# Patient Record
Sex: Male | Born: 1980 | Race: Black or African American | Hispanic: No | Marital: Single | Smoking: Former smoker
Health system: Southern US, Community
[De-identification: ages and names within clinical notes are randomized; demographics above are authoritative.]

## PROBLEM LIST (undated history)

## (undated) DIAGNOSIS — E669 Obesity, unspecified: Secondary | ICD-10-CM

## (undated) DIAGNOSIS — I10 Essential (primary) hypertension: Secondary | ICD-10-CM

## (undated) DIAGNOSIS — R002 Palpitations: Secondary | ICD-10-CM

## (undated) HISTORY — DX: Palpitations: R00.2

---

## 2001-11-30 ENCOUNTER — Ambulatory Visit (HOSPITAL_COMMUNITY): Admission: RE | Admit: 2001-11-30 | Discharge: 2001-11-30 | Payer: Self-pay | Admitting: Internal Medicine

## 2001-11-30 ENCOUNTER — Encounter: Payer: Self-pay | Admitting: Internal Medicine

## 2004-10-25 ENCOUNTER — Emergency Department (HOSPITAL_COMMUNITY): Admission: EM | Admit: 2004-10-25 | Discharge: 2004-10-25 | Payer: Self-pay | Admitting: Emergency Medicine

## 2011-04-04 ENCOUNTER — Emergency Department (HOSPITAL_COMMUNITY)
Admission: EM | Admit: 2011-04-04 | Discharge: 2011-04-04 | Disposition: A | Discharge status: Home / Self Care | Payer: Self-pay | Attending: Emergency Medicine | Admitting: Emergency Medicine

## 2011-04-04 DIAGNOSIS — R002 Palpitations: Secondary | ICD-10-CM | POA: Insufficient documentation

## 2011-04-05 ENCOUNTER — Emergency Department (HOSPITAL_COMMUNITY): Payer: Self-pay

## 2011-04-05 LAB — DIFFERENTIAL
Basophils Absolute: 0.1 10*3/uL (ref 0.0–0.1)
Basophils Relative: 1 % (ref 0–1)
Eosinophils Absolute: 0.1 10*3/uL (ref 0.0–0.7)
Eosinophils Relative: 1 % (ref 0–5)
Lymphocytes Relative: 36 % (ref 12–46)
Lymphs Abs: 2.7 10*3/uL (ref 0.7–4.0)
Monocytes Absolute: 0.6 10*3/uL (ref 0.1–1.0)
Monocytes Relative: 8 % (ref 3–12)
Neutro Abs: 3.9 10*3/uL (ref 1.7–7.7)
Neutrophils Relative %: 53 % (ref 43–77)

## 2011-04-05 LAB — URINALYSIS, ROUTINE W REFLEX MICROSCOPIC
Bilirubin Urine: NEGATIVE
Glucose, UA: NEGATIVE mg/dL
Hgb urine dipstick: NEGATIVE
Ketones, ur: NEGATIVE mg/dL
Leukocytes, UA: NEGATIVE
Nitrite: NEGATIVE
Protein, ur: NEGATIVE mg/dL
Specific Gravity, Urine: 1.029 (ref 1.005–1.030)
Urobilinogen, UA: 1 mg/dL (ref 0.0–1.0)
pH: 6 (ref 5.0–8.0)

## 2011-04-05 LAB — CBC
HCT: 40.6 % (ref 39.0–52.0)
Hemoglobin: 14.4 g/dL (ref 13.0–17.0)
MCH: 28.9 pg (ref 26.0–34.0)
MCHC: 35.5 g/dL (ref 30.0–36.0)
MCV: 81.4 fL (ref 78.0–100.0)
Platelets: 357 10*3/uL (ref 150–400)
RBC: 4.99 MIL/uL (ref 4.22–5.81)
RDW: 13.4 % (ref 11.5–15.5)
WBC: 7.3 10*3/uL (ref 4.0–10.5)

## 2011-04-05 LAB — URINE CULTURE
Culture  Setup Time: 201207070058
Culture: NO GROWTH

## 2011-04-05 LAB — BASIC METABOLIC PANEL
BUN: 11 mg/dL (ref 6–23)
Chloride: 103 mEq/L (ref 96–112)
GFR calc non Af Amer: >60 mL/min (ref >60)
Glucose, Bld: 111 mg/dL — ABNORMAL HIGH (ref 70–99)
Potassium: 3.5 mEq/L (ref 3.5–5.1)

## 2011-04-05 LAB — CK TOTAL AND CKMB (NOT AT ARMC): CK, MB: 2.8 ng/mL (ref 0.3–4.0)

## 2011-04-07 ENCOUNTER — Encounter: Payer: Self-pay | Admitting: Cardiology

## 2011-04-07 ENCOUNTER — Encounter: Payer: Self-pay | Admitting: *Deleted

## 2011-04-07 ENCOUNTER — Ambulatory Visit (INDEPENDENT_AMBULATORY_CARE_PROVIDER_SITE_OTHER): Payer: Self-pay | Admitting: Cardiology

## 2011-04-07 VITALS — BP 148/94 | HR 104 | Resp 16 | Ht 68.0 in | Wt 371.0 lb

## 2011-04-07 DIAGNOSIS — I10 Essential (primary) hypertension: Secondary | ICD-10-CM

## 2011-04-07 DIAGNOSIS — R002 Palpitations: Secondary | ICD-10-CM

## 2011-04-07 NOTE — Assessment & Plan Note (Signed)
This is secondary to sinus tachycardia. He was using a lot of energy drinks at a time he went to the emergency room. I've advised against these as well as caffeinated beverages in general. This is also related to his deconditioning which should improve with exercise.

## 2011-04-07 NOTE — Patient Instructions (Signed)
Your physician encouraged you to lose weight for better health.  Your physician has requested that you regularly monitor and record your blood pressure readings at home. Please use the same machine at the same time of day to check your readings and record them to bring to your follow-up visit.   Goal blood pressure is 130/80  Follow a very low sodium diet. Please see handout.  Also avoid carbohydrates such as rice, white breads, potatoes.  You may go to the gym with your brother 3-5 hours per week for cardio work out. No heavy lifting.

## 2011-04-07 NOTE — Assessment & Plan Note (Signed)
We had a long talk about this today. Advised at least get below 300 pounds which  would be a success if he could keep it off.

## 2011-04-07 NOTE — Assessment & Plan Note (Signed)
This is a new diagnosis. I suspect this is secondary to his weight, dietary habits, lack of exercise.  I spent 30 minutes talking to him about weight reduction to at least less than 300 pounds over the next year with a diet he can stick with. We've also talked about cardiac activity of at least 3-5 hours per week. We have also talked about eliminating complex carbs, and the calories, and sticking to a low sodium 2 g sodium diet. He will followup with Dr.Abrueve in August. Blood pressure goal was 130/80. He may be able to avoid pharmacological therapy which would be a success.

## 2011-04-07 NOTE — Progress Notes (Signed)
HPI Lance Neal is referred today by Dr. Clarene Duke for the evaluation of palpitations and sinus tachycardia. He also is hypertensive in the office today.  This past Friday, follow work, he developed the feeling that his heart was racing. He went to the emergency room at [facility]. EKG showed sinus tachycardia at a rate of 113 beats per minute, normal otherwise.  His hemoglobin was 14.4, white count 7.3, urinalysis was normal without any sugar, potassium was borderline at 3.5, cardiac marker was negative x1 d-dimer was normal chest x-ray showed no acute cardiopulmonary disease with a normal heart size, and a head CT for her headache was negative.  He is anxious to start exercising with his brother. He is very creative and enjoys singing and rarely does exercise.  He seems very sincere about getting a healthy lifestyle.  He denies orthopnea, PND or edema. His head no chest pain or angina. History reviewed. No pertinent past medical history.  History reviewed. No pertinent past surgical history.  History reviewed. No pertinent family history.  History   Social History  . Marital Status: Single    Spouse Name: N/A    Number of Children: N/A  . Years of Education: N/A   Occupational History  . Not on file.   Social History Main Topics  . Smoking status: Former Smoker -- 1 years    Types: Cigarettes  . Smokeless tobacco: Never Used  . Alcohol Use: No  . Drug Use: No  . Sexually Active:    Other Topics Concern  . Not on file   Social History Narrative  . No narrative on file    Allergies  Allergen Reactions  . No Known Allergies     No current outpatient prescriptions on file.    ROS Negative other than HPI.   PE General Appearance: well developed, well nourished in no acute distress, morbidly obese HEENT: symmetrical face, PERRLA, good dentition  Neck: no JVD, thyromegaly, or adenopathy, trachea midline Chest: symmetric without deformity Cardiac: PMI difficult to  appreciate, RRR, normal S1, S2, no gallop or murmur Lung: clear to ausculation and percussion Vascular: all pulses full without bruits  Abdominal: nondistended, nontender, good bowel sounds, no HSM, no bruits Extremities: no cyanosis, clubbing or edema, no sign of DVT, no varicosities  Skin: normal color, no rashes Neuro: alert and oriented x 3, non-focal Pysch: normal affect Filed Vitals:   04/07/11 1606  BP: 148/94  Pulse: 104  Resp: 16  Height: 5\' 8"  (1.727 m)  Weight: 371 lb (168.284 kg)    EKG  Labs and Studies Reviewed.   Lab Results  Component Value Date   WBC 7.3 04/05/2011   HGB 14.4 04/05/2011   HCT 40.6 04/05/2011   MCV 81.4 04/05/2011   PLT 357 04/05/2011      Chemistry      Component Value Date/Time   NA 140 04/05/2011 0000   K 3.5 04/05/2011 0000   CL 103 04/05/2011 0000   CO2 27 04/05/2011 0000   BUN 11 04/05/2011 0000   CREATININE 0.73 04/05/2011 0000      Component Value Date/Time   CALCIUM 8.8 04/05/2011 0000       No results found for this basename: CHOL   No results found for this basename: HDL   No results found for this basename: LDLCALC   No results found for this basename: TRIG   No results found for this basename: CHOLHDL   No results found for this basename: HGBA1C  No results found for this basename: ALT, AST, GGT, ALKPHOS, BILITOT   No results found for this basename: TSH

## 2012-05-13 ENCOUNTER — Emergency Department (INDEPENDENT_AMBULATORY_CARE_PROVIDER_SITE_OTHER)
Admission: EM | Admit: 2012-05-13 | Discharge: 2012-05-13 | Disposition: A | Discharge status: Home / Self Care | Payer: Self-pay | Source: Home / Self Care | Attending: Emergency Medicine | Admitting: Emergency Medicine

## 2012-05-13 ENCOUNTER — Encounter (HOSPITAL_COMMUNITY): Payer: Self-pay | Admitting: *Deleted

## 2012-05-13 DIAGNOSIS — I10 Essential (primary) hypertension: Secondary | ICD-10-CM

## 2012-05-13 DIAGNOSIS — K029 Dental caries, unspecified: Secondary | ICD-10-CM

## 2012-05-13 DIAGNOSIS — T887XXA Unspecified adverse effect of drug or medicament, initial encounter: Secondary | ICD-10-CM

## 2012-05-13 HISTORY — DX: Obesity, unspecified: E66.9

## 2012-05-13 HISTORY — DX: Essential (primary) hypertension: I10

## 2012-05-13 LAB — POCT I-STAT, CHEM 8
Calcium, Ion: 1.16 mmol/L (ref 1.12–1.23)
HCT: 47 % (ref 39.0–52.0)
Hemoglobin: 16 g/dL (ref 13.0–17.0)
TCO2: 25 mmol/L (ref 0–100)

## 2012-05-13 LAB — POCT URINALYSIS DIP (DEVICE)
Glucose, UA: NEGATIVE mg/dL
Nitrite: NEGATIVE
Urobilinogen, UA: 1 mg/dL (ref 0.0–1.0)

## 2012-05-13 MED ORDER — CHLORHEXIDINE GLUCONATE 0.12 % MT SOLN: OROMUCOSAL | Status: AC

## 2012-05-13 MED ORDER — HYDROCHLOROTHIAZIDE 25 MG PO TABS: 25.0000 mg | ORAL_TABLET | Freq: Every day | ORAL | Status: DC

## 2012-05-13 NOTE — ED Notes (Signed)
Patient stated that he could not void at this time

## 2012-05-13 NOTE — ED Provider Notes (Signed)
History     CSN: [id number]  Arrival date & time 05/13/12  1333   First MD Initiated Contact with Patient 05/13/12 1351      Chief Complaint  Patient presents with  . Hypertension    (Consider location/radiation/quality/duration/timing/severity/associated sxs/prior treatment) HPI Comments: Patient has multiple complaints. First, he complains of posterior upper and lower dental pain where he has had chronic dental decay. No fevers, trismus, purulent drainage from gums. He was evaluated by a dentist to have them removed, but she would not do the procedure until his blood pressure is under control. He has noted that his blood pressures in the 200s/90s to 100s for the past several weeks, so he started himself on a friend's hydralazine. States he is taking somewhere between 25-50 mg a day. He took 75 mg of hydralazine today, reports a headache starting shortly thereafter. He denies any headaches prior to taking the hydralazine. Currently, he denies visual changes, focal paresis, seizures. No chest pain, shortness breath, palpitations, but surely swelling, pain radiating to his back or abdomen. No anuria, hematuria. No illicit drug use, nasal decongestants.  dx'd with HTN last year 03/2011, advised lifestyle changes including weight loss, stress reduction. Put on bystolic which he took for a month and ran out- last time he took this was a year ago. He states this worked very well for his blood pressure.   Patient is a 31 y.o. male presenting with hypertension. The history is provided by the patient. No language interpreter was used.  Hypertension This is a chronic problem. The current episode started more than 1 week ago. The problem occurs constantly. The problem has not changed since onset.Associated symptoms include headaches. Pertinent negatives include no chest pain, no abdominal pain and no shortness of breath. Nothing aggravates the symptoms. Nothing relieves the symptoms.    Past Medical  History  Diagnosis Date  . Hypertension   . Obesity     History reviewed. No pertinent past surgical history.  Family History  Problem Relation Age of Onset  . Cancer Mother   . Diabetes Mother   . Heart failure Mother   . Cancer Father   . Diabetes Father   . Heart failure Father     History  Substance Use Topics  . Smoking status: Former Smoker -- 1 years    Types: Cigarettes  . Smokeless tobacco: Never Used  . Alcohol Use: No      Review of Systems  Respiratory: Negative for shortness of breath.   Cardiovascular: Negative for chest pain.  Gastrointestinal: Negative for abdominal pain.  Neurological: Positive for headaches.    Allergies  No known allergies  Home Medications   Current Outpatient Rx  Name Route Sig Dispense Refill  . ACETAMINOPHEN-CODEINE #3 300-30 MG PO TABS Oral Take 1 tablet by mouth every 4 (four) hours as needed.    . CHLORHEXIDINE GLUCONATE 0.12 % MT SOLN  15 mL swish and spit bid 120 mL 0  . HYDROCHLOROTHIAZIDE 25 MG PO TABS Oral Take 1 tablet (25 mg total) by mouth daily. 30 tablet 0    BP 152/99  Pulse 119  Temp 98.4 F (36.9 C) (Oral)  Resp 18  SpO2 100% BP Readings from Last 3 Encounters:  05/13/12 152/99  04/07/11 148/94     Physical Exam  Vitals reviewed. Constitutional: He appears well-developed and well-nourished. No distress.  HENT:  Head: No trismus in the jaw.  Right Ear: Tympanic membrane normal.  Left Ear: Tympanic membrane normal.  Nose: Nose normal. Right sinus exhibits no maxillary sinus tenderness and no frontal sinus tenderness. Left sinus exhibits no maxillary sinus tenderness and no frontal sinus tenderness.  Mouth/Throat: Uvula is midline, oropharynx is clear and moist and mucous membranes are normal. Abnormal dentition. Dental caries present. No dental abscesses.    Eyes: Conjunctivae and EOM are normal. Pupils are equal, round, and reactive to light.  Fundoscopic exam:      The right eye shows no  hemorrhage and no papilledema.       The left eye shows no hemorrhage and no papilledema.  Neck: Normal range of motion. Neck supple. Normal range of motion present. No Brudzinski's sign and no Kernig's sign noted.  Cardiovascular: Normal rate.   Pulmonary/Chest: Effort normal.  Abdominal: He exhibits no distension.  Musculoskeletal: Normal range of motion.  Lymphadenopathy:    He has no cervical adenopathy.  Neurological: He is alert. He has normal strength and normal reflexes. No cranial nerve deficit or sensory deficit. Coordination and gait normal.  Skin: Skin is warm. No rash noted.  Psychiatric: He has a normal mood and affect. His speech is normal and behavior is normal. Judgment and thought content normal.    ED Course  Procedures (including critical care time)  Labs Reviewed  POCT I-STAT, CHEM 8 - Abnormal; Notable for the following:    Glucose, Bld 106 (*)     All other components within normal limits  POCT URINALYSIS DIP (DEVICE) - Abnormal; Notable for the following:    Ketones, ur 40 (*)     pH 8.5 (*)     All other components within normal limits   No results found. Results for orders placed during the hospital encounter of 05/13/12  POCT I-STAT, CHEM 8      Component Value Range   Sodium 141  135 - 145 mEq/L   Potassium 4.3  3.5 - 5.1 mEq/L   Chloride 104  96 - 112 mEq/L   BUN 6  6 - 23 mg/dL   Creatinine, Ser 1.61  0.50 - 1.35 mg/dL   Glucose, Bld 096 (*) 70 - 99 mg/dL   Calcium, Ion 0.45  4.09 - 1.23 mmol/L   TCO2 25  0 - 100 mmol/L   Hemoglobin 16.0  13.0 - 17.0 g/dL   HCT 81.1  91.4 - 78.2 %  POCT URINALYSIS DIP (DEVICE)      Component Value Range   Glucose, UA NEGATIVE  NEGATIVE mg/dL   Bilirubin Urine NEGATIVE  NEGATIVE   Ketones, ur 40 (*) NEGATIVE mg/dL   Specific Gravity, Urine 1.015  1.005 - 1.030   Hgb urine dipstick NEGATIVE  NEGATIVE   pH 8.5 (*) 5.0 - 8.0   Protein, ur NEGATIVE  NEGATIVE mg/dL   Urobilinogen, UA 1.0  0.0 - 1.0 mg/dL    Nitrite NEGATIVE  NEGATIVE   Leukocytes, UA NEGATIVE  NEGATIVE     1. Hypertension   2. Dental decay   3. Non-dose-related adverse reaction to medication       MDM    Previous chart reviewed. Additional medical history as noted in history of present illness.   Pt hypertensive today. no red flags. Headaches most likely from the large hydralazine dose. He is neurologically intact. No evidence of dental emergency. Discussed importance of lifestyle modifications as important first steps. Will start him on hydrochlorothiazide, have him followup with his primary care physician or return here in one week for repeat blood pressure rechecked. Will start him  on an ACE inhibitor or beta blocker his blood pressure is not improved. Discussed signs and symptoms that should prompt is immediate return to the ER. Patient agrees with plan.     Luiz Blare, MD 05/13/12 1531

## 2012-05-13 NOTE — ED Notes (Signed)
Pt reports htn for the past year that has been untreated (ran out of meds that he was previously taking) - states that he has been taking "family friend's hydralize" for the past 2 weeks.  "i have a headache today and I got a little scared"

## 2012-06-03 IMAGING — CR DG CHEST 2V
2 series · 2 of 2 positions shown · non-contrast
Comparison: None.

CLINICAL DATA: Dizziness and hypertension

CHEST - 2 VIEW

[w chest pa]
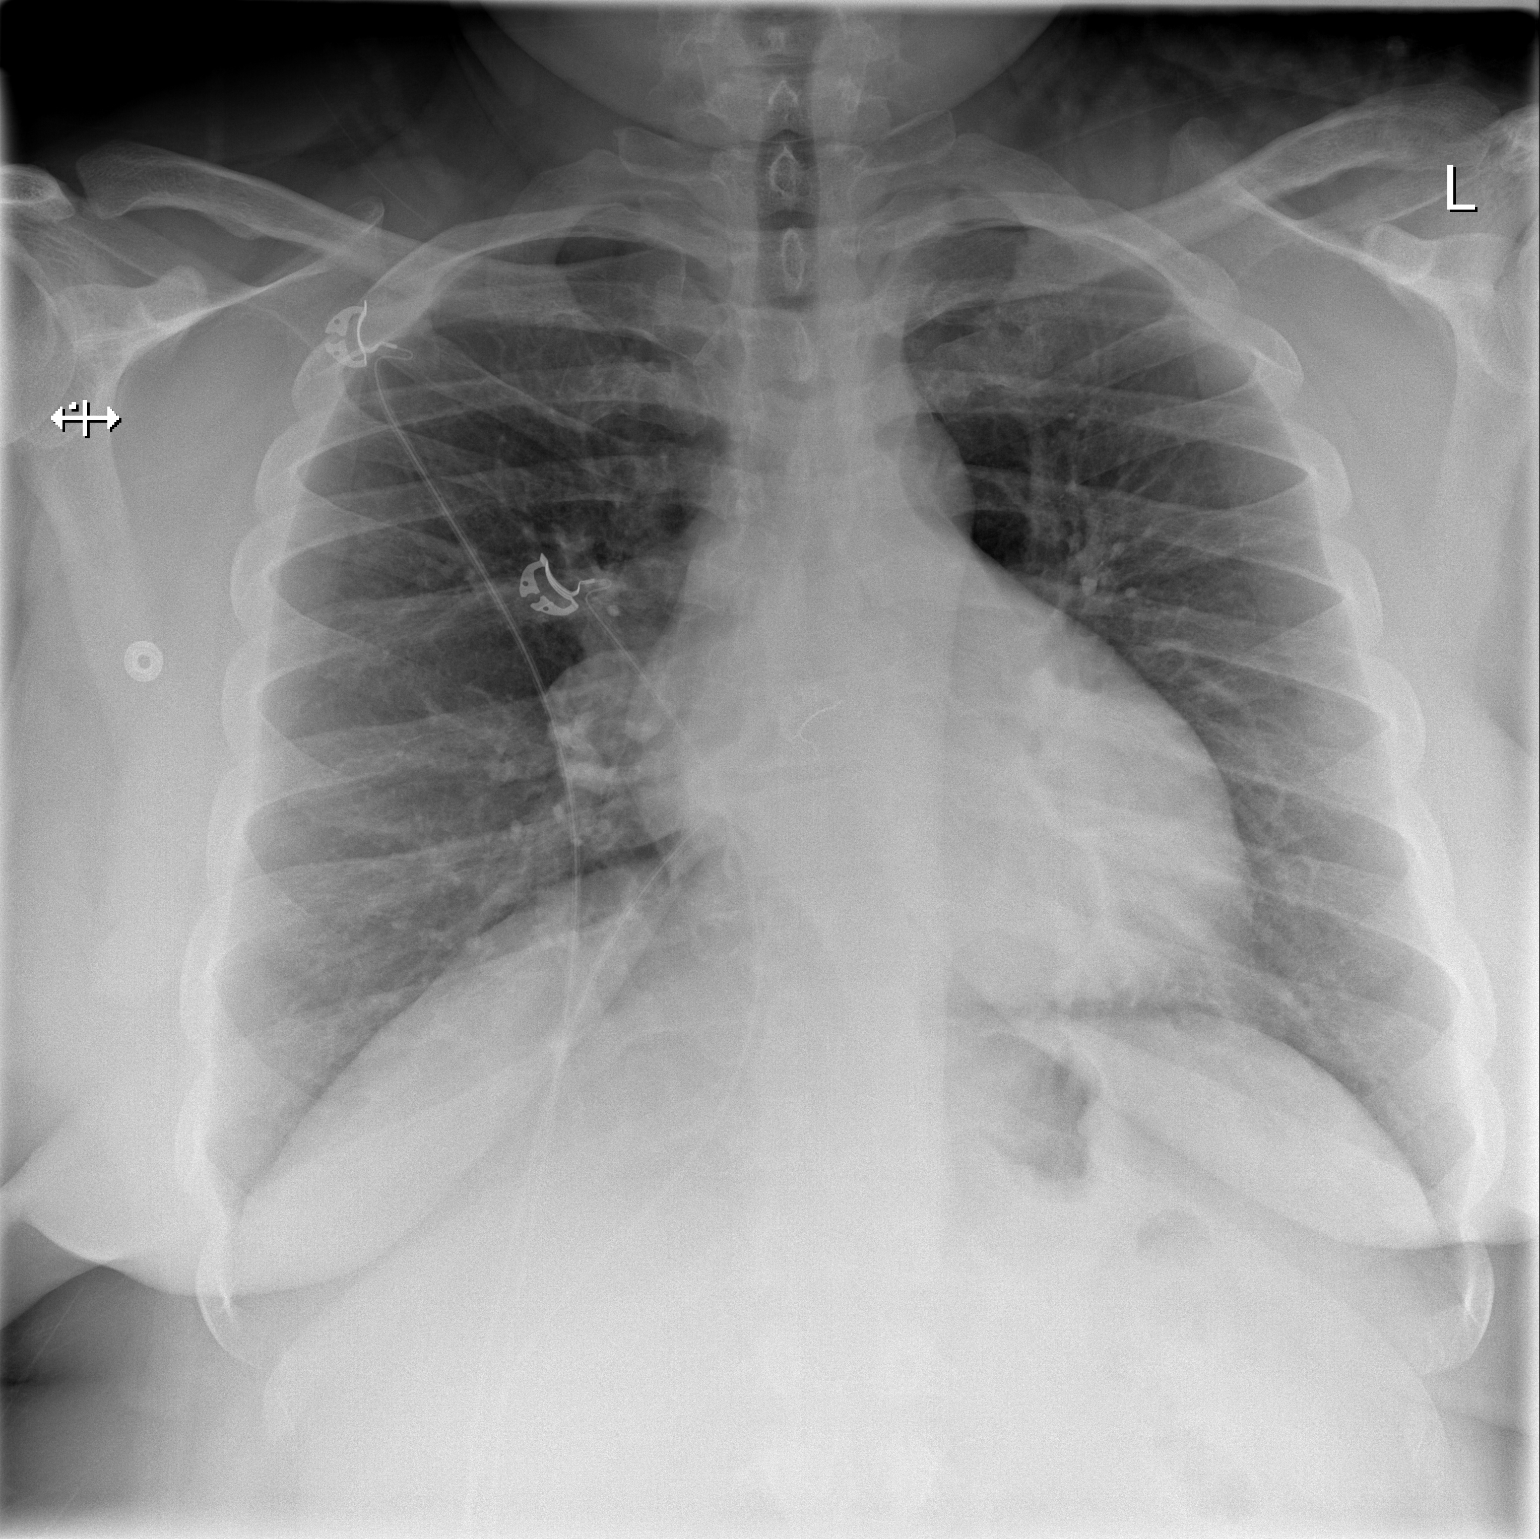

[w chest lat]
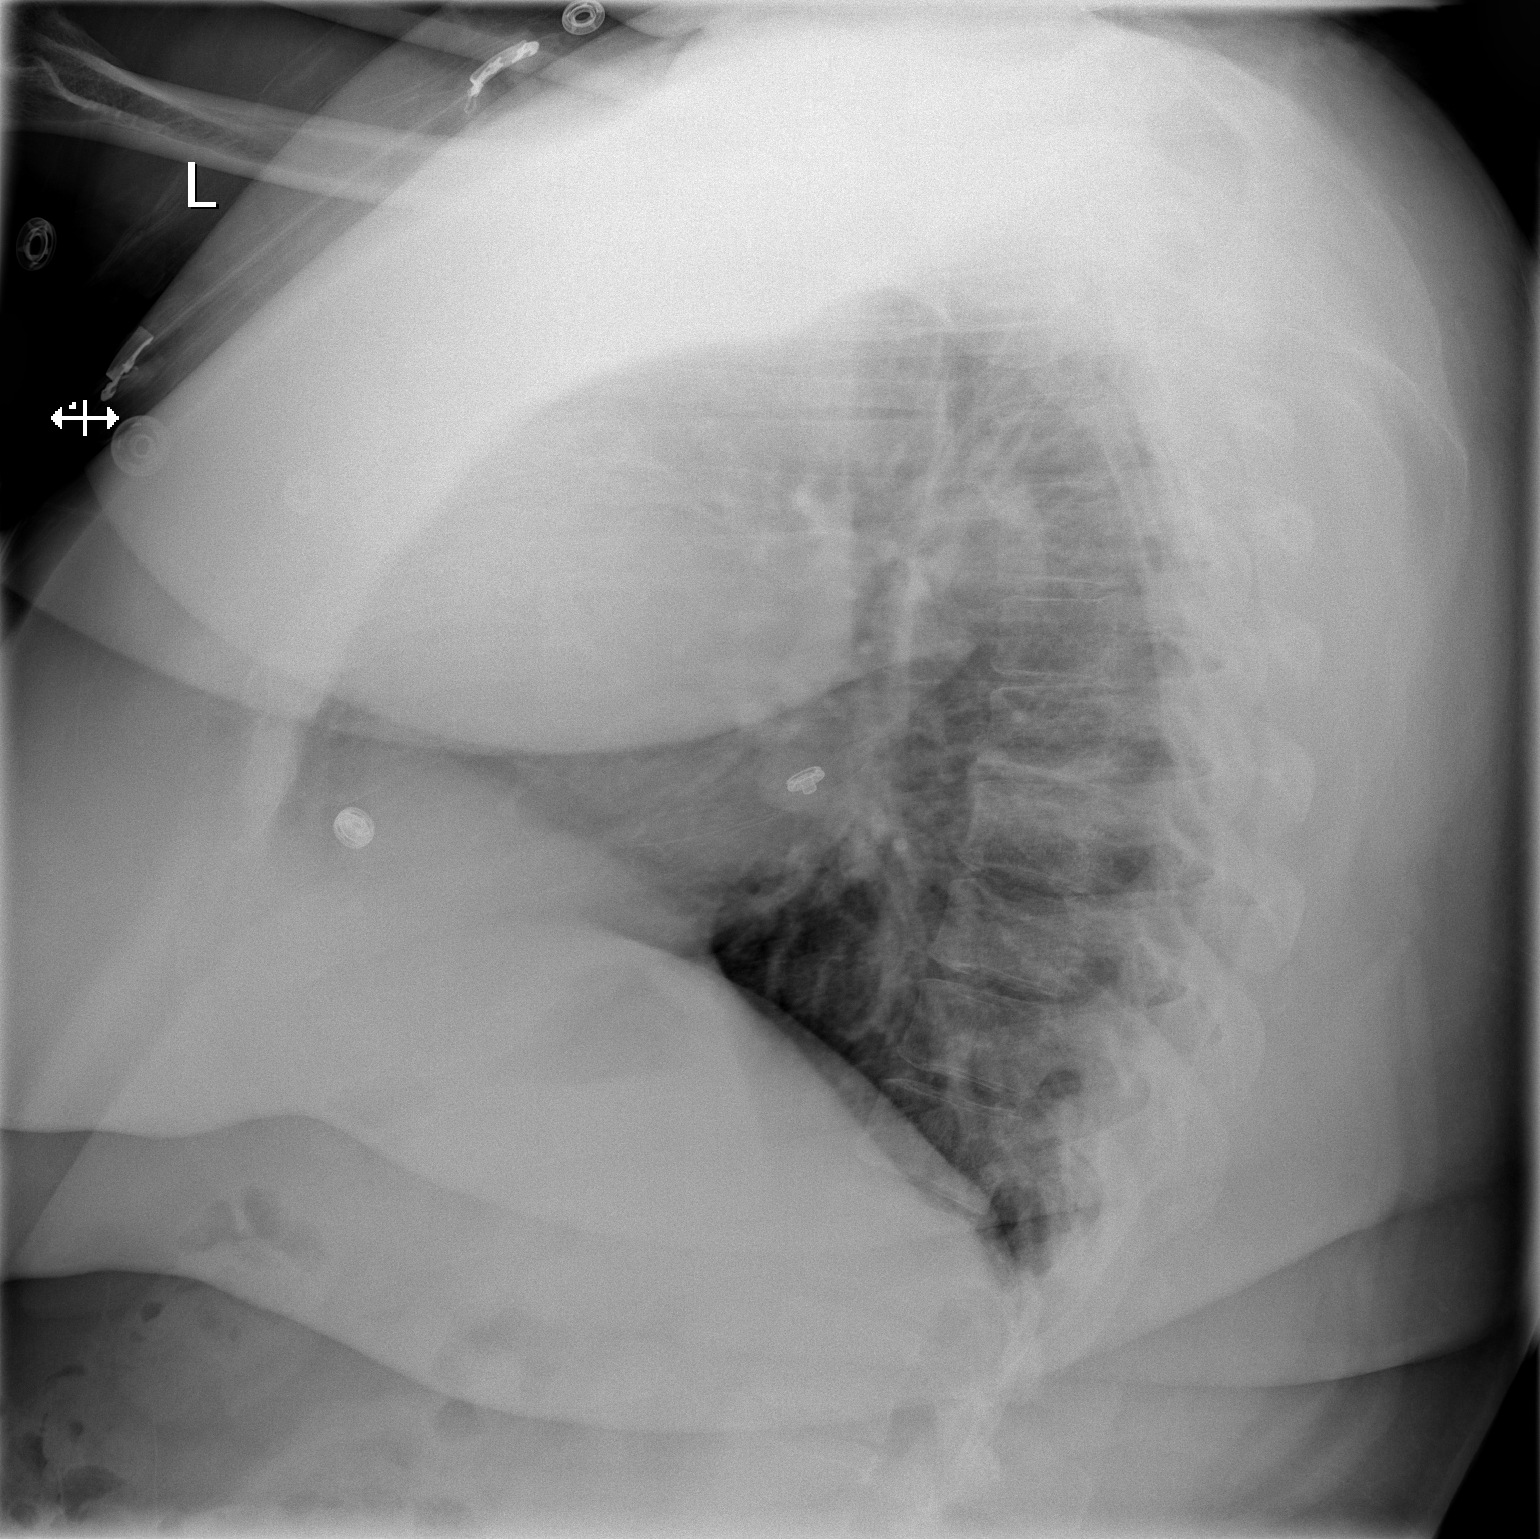

[2 of 2 positions shown; findings below may reference images not displayed]

FINDINGS: Rounded density projected over the right hilum probably
represents a superimposed patch. The heart size and pulmonary
vascularity are normal. The lungs appear clear and expanded without
focal air space disease or consolidation. No blunting of the
costophrenic angles.
IMPRESSION: No evidence of active pulmonary disease.

## 2014-01-12 ENCOUNTER — Emergency Department (INDEPENDENT_AMBULATORY_CARE_PROVIDER_SITE_OTHER)
Admission: EM | Admit: 2014-01-12 | Discharge: 2014-01-12 | Disposition: A | Discharge status: Home / Self Care | Payer: Self-pay | Source: Home / Self Care | Attending: Family Medicine | Admitting: Family Medicine

## 2014-01-12 ENCOUNTER — Encounter (HOSPITAL_COMMUNITY): Payer: Self-pay | Admitting: Emergency Medicine

## 2014-01-12 DIAGNOSIS — I10 Essential (primary) hypertension: Secondary | ICD-10-CM

## 2014-01-12 LAB — POCT I-STAT, CHEM 8
BUN: 15 mg/dL (ref 6–23)
CALCIUM ION: 1.13 mmol/L (ref 1.12–1.23)
CHLORIDE: 99 meq/L (ref 96–112)
CREATININE: 1.1 mg/dL (ref 0.50–1.35)
GLUCOSE: 99 mg/dL (ref 70–99)
HCT: 48 % (ref 39.0–52.0)
Hemoglobin: 16.3 g/dL (ref 13.0–17.0)
Potassium: 3.8 mEq/L (ref 3.7–5.3)
Sodium: 139 mEq/L (ref 137–147)
TCO2: 29 mmol/L (ref 0–100)

## 2014-01-12 MED ORDER — LISINOPRIL 20 MG PO TABS: 20.0000 mg | ORAL_TABLET | Freq: Every day | ORAL | Status: DC

## 2014-01-12 NOTE — ED Notes (Signed)
C/o hypertension States he has been without medication for a year now States he has been trying to lose weight and eat right

## 2014-01-12 NOTE — ED Provider Notes (Signed)
CSN: [id number]     Arrival date & time 01/12/14  1012 History   First MD Initiated Contact with Patient 01/12/14 1116     Chief Complaint  Patient presents with  . Hypertension   (Consider location/radiation/quality/duration/timing/severity/associated sxs/prior Treatment) HPI Comments: Pt has not taken bp meds in a year. Is getting insurance soon, wants to restart meds. Is focusing on diet, weight loss.   Patient is a 33 y.o. male presenting with hypertension. The history is provided by the patient.  Hypertension This is a chronic problem. Episode onset: years ago. The problem occurs constantly. The problem has not changed since onset.Pertinent negatives include no chest pain, no headaches and no shortness of breath. Nothing aggravates the symptoms. Nothing relieves the symptoms. Treatments tried: hctz - pt did not like it. Improvement on treatment: improvement when taking it.    Past Medical History  Diagnosis Date  . Hypertension   . Obesity    History reviewed. No pertinent past surgical history. Family History  Problem Relation Age of Onset  . Cancer Mother   . Diabetes Mother   . Heart failure Mother   . Cancer Father   . Diabetes Father   . Heart failure Father    History  Substance Use Topics  . Smoking status: Former Smoker -- 1 years    Types: Cigarettes  . Smokeless tobacco: Never Used  . Alcohol Use: No    Review of Systems  Constitutional: Negative for unexpected weight change.  Eyes: Negative for visual disturbance.  Respiratory: Negative for shortness of breath.   Cardiovascular: Negative for chest pain, palpitations and leg swelling.  Neurological: Negative for dizziness and headaches.    Allergies  No known allergies  Home Medications   Prior to Admission medications   Medication Sig Start Date End Date Taking? Authorizing Provider  acetaminophen-codeine (TYLENOL #3) 300-30 MG per tablet Take 1 tablet by mouth every 4 (four) hours as needed.     Historical Provider, MD  hydrochlorothiazide (HYDRODIURIL) 25 MG tablet Take 1 tablet (25 mg total) by mouth daily. 05/13/12 05/13/13  Domenick GongAshley Mortenson, MD  lisinopril (PRINIVIL,ZESTRIL) 20 MG tablet Take 1 tablet (20 mg total) by mouth daily. 01/12/14   Cathlyn ParsonsAngela M Drexler Maland, NP   BP 160/83  Pulse 86  Temp(Src) 98.1 F (36.7 C) (Oral)  Resp 18  SpO2 100% Physical Exam  Constitutional: He appears well-developed and well-nourished. No distress.  Morbidly obese  Neck: Carotid bruit is not present.  Cardiovascular: Normal rate and regular rhythm.   No peripheral edema  Pulmonary/Chest: Effort normal and breath sounds normal.    ED Course  Procedures (including critical care time) Labs Review Labs Reviewed  POCT I-STAT, CHEM 8    Results for orders placed during the hospital encounter of 01/12/14  POCT I-STAT, CHEM 8      Result Value Ref Range   Sodium 139  137 - 147 mEq/L   Potassium 3.8  3.7 - 5.3 mEq/L   Chloride 99  96 - 112 mEq/L   BUN 15  6 - 23 mg/dL   Creatinine, Ser 4.091.10  0.50 - 1.35 mg/dL   Glucose, Bld 99  70 - 99 mg/dL   Calcium, Ion 8.111.13  9.141.12 - 1.23 mmol/L   TCO2 29  0 - 100 mmol/L   Hemoglobin 16.3  13.0 - 17.0 g/dL   HCT 78.248.0  95.639.0 - 21.352.0 %   Imaging Review No results found.   MDM   1. Hypertension  Discussed diet and exercise/lifestyle change strategies with patient extensively.  Pt is taking some kind of OTC supplement recommended by Dr. Neil Crouchz on TV. I advised pt I recommended he not use unstudied unregulated supplements. Rx lisinopril 20mg  daily #30, 1 refill. Pt to get pcp.      Cathlyn ParsonsAngela M Yao [place], NP 01/12/14 1407

## 2014-01-12 NOTE — Discharge Instructions (Signed)
Work on the diet changes and increasing exercise as we discussed. Take your medicine every day even if you are feeling well. Try to find a regular doctor to get regular care with your blood pressure and wellness screening/maintenance.    Hypertension As your heart beats, it forces blood through your arteries. This force is your blood pressure. If the pressure is too high, it is called hypertension (HTN) or high blood pressure. HTN is dangerous because you may have it and not know it. High blood pressure may mean that your heart has to work harder to pump blood. Your arteries may be narrow or stiff. The extra work puts you at risk for heart disease, stroke, and other problems.  Blood pressure consists of two numbers, a higher number over a lower, 110/72, for example. It is stated as "110 over 72." The ideal is below 120 for the top number (systolic) and under 80 for the bottom (diastolic). Write down your blood pressure today. You should pay close attention to your blood pressure if you have certain conditions such as:  Heart failure.  Prior heart attack.  Diabetes  Chronic kidney disease.  Prior stroke.  Multiple risk factors for heart disease. To see if you have HTN, your blood pressure should be measured while you are seated with your arm held at the level of the heart. It should be measured at least twice. A one-time elevated blood pressure reading (especially in the Emergency Department) does not mean that you need treatment. There may be conditions in which the blood pressure is different between your right and left arms. It is important to see your caregiver soon for a recheck. Most people have essential hypertension which means that there is not a specific cause. This type of high blood pressure may be lowered by changing lifestyle factors such as:  Stress.  Smoking.  Lack of exercise.  Excessive weight.  Drug/tobacco/alcohol use.  Eating less salt. Most people do not have  symptoms from high blood pressure until it has caused damage to the body. Effective treatment can often prevent, delay or reduce that damage. TREATMENT  When a cause has been identified, treatment for high blood pressure is directed at the cause. There are a large number of medications to treat HTN. These fall into several categories, and your caregiver will help you select the medicines that are best for you. Medications may have side effects. You should review side effects with your caregiver. If your blood pressure stays high after you have made lifestyle changes or started on medicines,   Your medication(s) may need to be changed.  Other problems may need to be addressed.  Be certain you understand your prescriptions, and know how and when to take your medicine.  Be sure to follow up with your caregiver within the time frame advised (usually within two weeks) to have your blood pressure rechecked and to review your medications.  If you are taking more than one medicine to lower your blood pressure, make sure you know how and at what times they should be taken. Taking two medicines at the same time can result in blood pressure that is too low. SEEK IMMEDIATE MEDICAL CARE IF:  You develop a severe headache, blurred or changing vision, or confusion.  You have unusual weakness or numbness, or a faint feeling.  You have severe chest or abdominal pain, vomiting, or breathing problems. MAKE SURE YOU:   Understand these instructions.  Will watch your condition.  Will get help  right away if you are not doing well or get worse. Document Released: 09/15/2005 Document Revised: 12/08/2011 Document Reviewed: 05/05/2008 [facility] Patient Information 2014 [employer], [place].

## 2014-01-13 NOTE — ED Provider Notes (Signed)
Medical screening examination/treatment/procedure(s) were performed by a resident physician or non-physician practitioner and as the supervising physician I was immediately available for consultation/collaboration.  Chavie Kolinski, MD    Jeffie Spivack S Suzie Vandam, MD 01/13/14 0806 

## 2014-03-31 ENCOUNTER — Ambulatory Visit: Payer: Self-pay | Admitting: Family Medicine

## 2016-06-20 ENCOUNTER — Ambulatory Visit (INDEPENDENT_AMBULATORY_CARE_PROVIDER_SITE_OTHER): Payer: PRIVATE HEALTH INSURANCE | Admitting: Physician Assistant

## 2016-06-20 ENCOUNTER — Encounter: Payer: Self-pay | Admitting: Physician Assistant

## 2016-06-20 VITALS — BP 162/104 | HR 87 | Temp 98.2°F | Resp 17 | Ht 69.0 in | Wt 357.0 lb

## 2016-06-20 DIAGNOSIS — I10 Essential (primary) hypertension: Secondary | ICD-10-CM | POA: Diagnosis not present

## 2016-06-20 DIAGNOSIS — E669 Obesity, unspecified: Secondary | ICD-10-CM

## 2016-06-20 LAB — COMPLETE METABOLIC PANEL WITH GFR
ALBUMIN: 3.8 g/dL (ref 3.6–5.1)
ALK PHOS: 63 U/L (ref 40–115)
ALT: 17 U/L (ref 9–46)
AST: 20 U/L (ref 10–40)
BUN: 8 mg/dL (ref 7–25)
CO2: 28 mmol/L (ref 20–31)
Calcium: 8.8 mg/dL (ref 8.6–10.3)
Chloride: 103 mmol/L (ref 98–110)
Creat: 0.87 mg/dL (ref 0.60–1.35)
GFR, Est African American: >89 mL/min (ref >=60)
GLUCOSE: 111 mg/dL — AB (ref 65–99)
POTASSIUM: 4.4 mmol/L (ref 3.5–5.3)
SODIUM: 138 mmol/L (ref 135–146)
Total Bilirubin: 0.5 mg/dL (ref 0.2–1.2)
Total Protein: 7.7 g/dL (ref 6.1–8.1)

## 2016-06-20 LAB — LIPID PANEL
CHOL/HDL RATIO: 4.1 ratio (ref <=5.0)
Cholesterol: 181 mg/dL (ref 125–200)
HDL: 44 mg/dL (ref >=40)
LDL Cholesterol: 122 mg/dL (ref <130)
TRIGLYCERIDES: 77 mg/dL (ref <150)
VLDL: 15 mg/dL (ref <30)

## 2016-06-20 MED ORDER — LISINOPRIL-HYDROCHLOROTHIAZIDE 20-12.5 MG PO TABS: 1.0000 | ORAL_TABLET | Freq: Every day | ORAL | 1 refills | Status: DC

## 2016-06-20 NOTE — Patient Instructions (Addendum)
Please take your blood pressure twice per week.  I would like you to follow up with me in 2 weeks.  Please read the diet plan below for increased guidance of what to eat.  DASH Eating Plan DASH stands for "Dietary Approaches to Stop Hypertension." The DASH eating plan is a healthy eating plan that has been shown to reduce high blood pressure (hypertension). Additional health benefits may include reducing the risk of type 2 diabetes mellitus, heart disease, and stroke. The DASH eating plan may also help with weight loss. WHAT DO I NEED TO KNOW ABOUT THE DASH EATING PLAN? For the DASH eating plan, you will follow these general guidelines:  Choose foods with a percent daily value for sodium of less than 5% (as listed on the food label).  Use salt-free seasonings or herbs instead of table salt or sea salt.  Check with your health care provider or pharmacist before using salt substitutes.  Eat lower-sodium products, often labeled as "lower sodium" or "no salt added."  Eat fresh foods.  Eat more vegetables, fruits, and low-fat dairy products.  Choose whole grains. Look for the word "whole" as the first word in the ingredient list.  Choose fish and skinless chicken or [place] more often than red meat. Limit fish, poultry, and meat to 6 oz (170 g) each day.  Limit sweets, desserts, sugars, and sugary drinks.  Choose heart-healthy fats.  Limit cheese to 1 oz (28 g) per day.  Eat more home-cooked food and less restaurant, buffet, and fast food.  Limit fried foods.  Cook foods using methods other than frying.  Limit canned vegetables. If you do use them, rinse them well to decrease the sodium.  When eating at a restaurant, ask that your food be prepared with less salt, or no salt if possible. WHAT FOODS CAN I EAT? Seek help from a dietitian for individual calorie needs. Grains Whole grain or whole wheat bread. Brown rice. Whole grain or whole wheat pasta. Quinoa, bulgur, and whole grain  cereals. Low-sodium cereals. Corn or whole wheat flour tortillas. Whole grain cornbread. Whole grain crackers. Low-sodium crackers. Vegetables Fresh or frozen vegetables (raw, steamed, roasted, or grilled). Low-sodium or reduced-sodium tomato and vegetable juices. Low-sodium or reduced-sodium tomato sauce and paste. Low-sodium or reduced-sodium canned vegetables.  Fruits All fresh, canned (in natural juice), or frozen fruits. Meat and Other Protein Products Ground beef (85% or leaner), grass-fed beef, or beef trimmed of fat. Skinless chicken or [place]. Ground chicken or [place]. Pork trimmed of fat. All fish and seafood. Eggs. Dried beans, peas, or lentils. Unsalted nuts and seeds. Unsalted canned beans. Dairy Low-fat dairy products, such as skim or 1% milk, 2% or reduced-fat cheeses, low-fat ricotta or cottage cheese, or plain low-fat yogurt. Low-sodium or reduced-sodium cheeses. Fats and Oils Tub margarines without trans fats. Light or reduced-fat mayonnaise and salad dressings (reduced sodium). Avocado. Safflower, olive, or canola oils. Natural peanut or almond butter. Other Unsalted popcorn and pretzels. The items listed above may not be a complete list of recommended foods or beverages. Contact your dietitian for more options. WHAT FOODS ARE NOT RECOMMENDED? Grains White bread. White pasta. White rice. Refined cornbread. Bagels and croissants. Crackers that contain trans fat. Vegetables Creamed or fried vegetables. Vegetables in a cheese sauce. Regular canned vegetables. Regular canned tomato sauce and paste. Regular tomato and vegetable juices. Fruits Dried fruits. Canned fruit in light or heavy syrup. Fruit juice. Meat and Other Protein Products Fatty cuts of meat. Ribs, chicken wings,  bacon, sausage, bologna, salami, chitterlings, fatback, hot dogs, bratwurst, and packaged luncheon meats. Salted nuts and seeds. Canned beans with salt. Dairy Whole or 2% milk, cream, half-and-half, and  cream cheese. Whole-fat or sweetened yogurt. Full-fat cheeses or blue cheese. Nondairy creamers and whipped toppings. Processed cheese, cheese spreads, or cheese curds. Condiments Onion and garlic salt, seasoned salt, table salt, and sea salt. Canned and packaged gravies. Worcestershire sauce. Tartar sauce. Barbecue sauce. Teriyaki sauce. Soy sauce, including reduced sodium. Steak sauce. Fish sauce. Oyster sauce. Cocktail sauce. Horseradish. Ketchup and mustard. Meat flavorings and tenderizers. Bouillon cubes. Hot sauce. Tabasco sauce. Marinades. Taco seasonings. Relishes. Fats and Oils Butter, stick margarine, lard, shortening, ghee, and bacon fat. Coconut, palm kernel, or palm oils. Regular salad dressings. Other Pickles and olives. Salted popcorn and pretzels. The items listed above may not be a complete list of foods and beverages to avoid. Contact your dietitian for more information. WHERE CAN I FIND MORE INFORMATION? National Heart, Lung, and Blood Institute: CablePromo.itwww.nhlbi.nih.gov/health/health-topics/topics/dash/   This information is not intended to replace advice given to you by your health care provider. Make sure you discuss any questions you have with your health care provider.   Document Released: 09/04/2011 Document Revised: 10/06/2014 Document Reviewed: 07/20/2013 Elsevier Interactive Patient Education 2016 [employer]     IF you received an x-ray today, you will receive an invoice from [facility]. Please contact [facility] at [phone] with questions or concerns regarding your invoice.   IF you received labwork today, you will receive an invoice from [facility]. Please contact [facility] at [phone] with questions or concerns regarding your invoice.   Our billing staff will not be able to assist you with questions regarding bills from these companies.  You will be contacted with the lab results as soon as they are available.  The fastest way to get your results is to activate your My Chart account. Instructions are located on the last page of this paperwork. If you have not heard from us regarding the results in 2 weeks, please contact this office.

## 2016-06-20 NOTE — Progress Notes (Signed)
Patient ID: Lance Neal, male   DOB: 02/07/1981, 35 y.o.   MRN: [id number] [facility] [address], [place] [address] [phone]  By signing my name below, I, Lance Neal, attest that this documentation has been prepared under the direction and in the presence of Trena PlattStephanie English, PA-C Electronically Signed: Charline BillsEssence Neal, ED Scribe 06/20/2016 at 8:43 AM.  Date:  06/20/2016   Name:  Lance ConstableLarry D Tanksley   DOB:  08/03/1981   MRN:  [id number]  PCP:  No PCP Per Patient   History of Present Illness:  Lance Neal is a 35 y.o. male patient who [facility]. Pt's triage BP: 162/104. Pt states that he was scheduled to have a root canal but was unable to have the procedure due to elevated BP. He has been taking a baby Aspirin daily. Pt denies chest pain, new palpitations, leg swelling, sob, new HAs. He does report a h/o chest palpitations. He states that he has not been exercising and has been eating a lot of candy bars, fast foods, soda and sweet tea. He states that he has stopped consuming pork chops. Pt states that he drinks ~ 0.5 gallon of water/day. He reports a familial h/o DM on both his paternal and maternal side. Pt states that he has tried garcinia cambogia and coffee cleanser a few years ago and lost 30 lbs in 1 month.   Patient Active Problem List   Diagnosis Date Noted   Hypertension 04/07/2011   Palpitations 04/07/2011   Obesity, morbid (more than 100 lbs over ideal weight or BMI > 40) (HCC) 04/07/2011    Past Medical History:  Diagnosis Date   Hypertension    Obesity     No past surgical history on file.  Social History  Substance Use Topics   Smoking status: Current Some Day Smoker    Years: 1.00    Types: Cigarettes   Smokeless tobacco: Never Used   Alcohol use No    Family History  Problem Relation Age of Onset   Cancer Mother    Diabetes Mother    Heart failure Mother    Cancer Father     Diabetes Father    Heart failure Father     Allergies  Allergen Reactions   No Known Allergies     Medication list has been reviewed and updated.  Current Outpatient Prescriptions on File Prior to Visit  Medication Sig Dispense Refill   hydrochlorothiazide (HYDRODIURIL) 25 MG tablet Take 1 tablet (25 mg total) by mouth daily. 30 tablet 0   lisinopril (PRINIVIL,ZESTRIL) 20 MG tablet Take 1 tablet (20 mg total) by mouth daily. (Patient not taking: Reported on 06/20/2016) 30 tablet 1   [DISCONTINUED] hydrALAZINE (APRESOLINE) 50 MG tablet Take 50 mg by mouth 3 (three) times daily.     No current facility-administered medications on file prior to visit.     Review of Systems  Respiratory: Negative for shortness of breath.   Cardiovascular: Negative for chest pain, palpitations and leg swelling.  Neurological: Negative for headaches.    Physical Examination: BP (!) 162/104 (BP Location: Right Arm, Patient Position: Sitting, Cuff Size: Large)    Pulse 87    Temp 98.2 F (36.8 C) (Oral)    Resp 17    Ht 5\' 9"  (1.753 m)    Wt (!) 357 lb (161.9 kg)    SpO2 96%    BMI 52.72 kg/m  Ideal Body  Weight: @FLOWAMB ([id number])@  Physical Exam  Constitutional: He is oriented to person, place, and time. He appears well-developed and well-nourished. No distress.  HENT:  Head: Normocephalic and atraumatic.  Eyes: Conjunctivae and EOM are normal. Pupils are equal, round, and reactive to light.  Cardiovascular: Normal rate, regular rhythm, normal heart sounds and normal pulses.  Exam reveals no gallop and no friction rub.   No murmur heard. Pulmonary/Chest: Effort normal and breath sounds normal. No respiratory distress.  Neurological: He is alert and oriented to person, place, and time.  Skin: Skin is warm and dry. He is not diaphoretic.  Psychiatric: He has a normal mood and affect. His behavior is normal.    Assessment and Plan: Lance Neal is a 35 y.o. male who is here today for  blood pressure recheckAnd restart of meds. Warned of possible complications if patient does not keep his blood pressure under control and limits his food intake. Given the DASH diet to start. Advise for him to return in 2 weeks for follow-up of blood pressure.  Essential hypertension - Plan: lisinopril-hydrochlorothiazide (PRINZIDE,ZESTORETIC) 20-12.5 MG tablet, COMPLETE METABOLIC PANEL WITH GFR, Lipid panel  Obesity - Plan: lisinopril-hydrochlorothiazide (PRINZIDE,ZESTORETIC) 20-12.5 MG tablet   Trena Platt, PA-C [facility] 06/20/2016 8:30 AM

## 2016-08-05 ENCOUNTER — Other Ambulatory Visit: Payer: Self-pay

## 2016-08-05 DIAGNOSIS — I10 Essential (primary) hypertension: Secondary | ICD-10-CM

## 2016-08-05 NOTE — Telephone Encounter (Signed)
06/20/16 last ov and refill Pharmacy requests 90 day

## 2016-08-06 NOTE — Telephone Encounter (Signed)
L/m with response and need for recheck of b/p

## 2016-08-06 NOTE — Telephone Encounter (Signed)
On 9/22, Lance Neal provided a 30-day supply with a refill, and asked the patient to RTC in 2 weeks to recheck the BP. 1. He shouldn't be out 2. He needs re-evaluation

## 2016-08-14 ENCOUNTER — Encounter: Payer: Self-pay | Admitting: Physician Assistant

## 2016-08-14 ENCOUNTER — Ambulatory Visit (INDEPENDENT_AMBULATORY_CARE_PROVIDER_SITE_OTHER): Payer: PRIVATE HEALTH INSURANCE | Admitting: Physician Assistant

## 2016-08-14 ENCOUNTER — Other Ambulatory Visit: Payer: Self-pay | Admitting: Physician Assistant

## 2016-08-14 VITALS — BP 150/90 | HR 92 | Temp 98.4°F | Resp 18 | Ht 68.0 in | Wt 361.6 lb

## 2016-08-14 DIAGNOSIS — E669 Obesity, unspecified: Secondary | ICD-10-CM

## 2016-08-14 DIAGNOSIS — Z114 Encounter for screening for human immunodeficiency virus [HIV]: Secondary | ICD-10-CM

## 2016-08-14 DIAGNOSIS — I10 Essential (primary) hypertension: Secondary | ICD-10-CM | POA: Diagnosis not present

## 2016-08-14 DIAGNOSIS — Z23 Encounter for immunization: Secondary | ICD-10-CM | POA: Diagnosis not present

## 2016-08-14 DIAGNOSIS — R739 Hyperglycemia, unspecified: Secondary | ICD-10-CM | POA: Diagnosis not present

## 2016-08-14 DIAGNOSIS — Z0000 Encounter for general adult medical examination without abnormal findings: Secondary | ICD-10-CM

## 2016-08-14 LAB — COMPLETE METABOLIC PANEL WITH GFR
ALK PHOS: 72 U/L (ref 40–115)
ALT: 17 U/L (ref 9–46)
AST: 20 U/L (ref 10–40)
Albumin: 3.9 g/dL (ref 3.6–5.1)
BILIRUBIN TOTAL: 0.5 mg/dL (ref 0.2–1.2)
BUN: 13 mg/dL (ref 7–25)
CALCIUM: 9 mg/dL (ref 8.6–10.3)
CO2: 26 mmol/L (ref 20–31)
CREATININE: 0.81 mg/dL (ref 0.60–1.35)
Chloride: 104 mmol/L (ref 98–110)
Glucose, Bld: 113 mg/dL — ABNORMAL HIGH (ref 65–99)
Potassium: 4.4 mmol/L (ref 3.5–5.3)
Sodium: 138 mmol/L (ref 135–146)
TOTAL PROTEIN: 7.8 g/dL (ref 6.1–8.1)

## 2016-08-14 LAB — HIV ANTIBODY (ROUTINE TESTING W REFLEX): HIV: NONREACTIVE

## 2016-08-14 LAB — HEMOGLOBIN A1C
Hgb A1c MFr Bld: 5.8 % — ABNORMAL HIGH (ref <5.7)
Mean Plasma Glucose: 120 mg/dL

## 2016-08-14 LAB — TSH: TSH: 0.99 m[IU]/L (ref 0.40–4.50)

## 2016-08-14 MED ORDER — LISINOPRIL-HYDROCHLOROTHIAZIDE 20-12.5 MG PO TABS: 1.0000 | ORAL_TABLET | Freq: Two times a day (BID) | ORAL | 1 refills | Status: DC

## 2016-08-14 NOTE — Patient Instructions (Addendum)
  My fitness pal - please use it  Take your blood pressure medications 2x times -    IF you received an x-ray today, you will receive an invoice from [facility]. Please contact [facility] at [phone] with questions or concerns regarding your invoice.   IF you received labwork today, you will receive an invoice from [facility]. Please contact [facility] at [phone] with questions or concerns regarding your invoice.   Our billing staff will not be able to assist you with questions regarding bills from these companies.  You will be contacted with the lab results as soon as they are available. The fastest way to get your results is to activate your My Chart account. Instructions are located on the last page of this paperwork. If you have not heard from us regarding the results in 2 weeks, please contact this office.

## 2016-08-14 NOTE — Progress Notes (Signed)
Lance Neal  MRN: [id number] DOB: 08/20/1981  Subjective:  Pt presents to clinic for a CPE.  He has HTN that was diagnosed about 6 weeks ago.  He has been taking his medication daily and he has been checking his BP at home - 140s/80 is the lowest.  Since being diagnosed with HTN he has been trying to exercise and eat healthier.  Last dental exam: appt scheduled for root canal Last vision exam: appt coming up  Vaccinations      Tetanus - unsure - but does not want it today    Exercise: started about 6 weeks ago - walk about 2 miles about 1-2 times a week Diet: doing better - increase in yogurt and fruits, decrease fried foods - does eat fast food - snacks a lot and not on healthy foods all the time  Patient Active Problem List   Diagnosis Date Noted  . Hypertension 04/07/2011  . Palpitations 04/07/2011  . Obesity, morbid (more than 100 lbs over ideal weight or BMI > 40) (HCC) 04/07/2011    Current Outpatient Prescriptions on File Prior to Visit  Medication Sig Dispense Refill  . [DISCONTINUED] hydrALAZINE (APRESOLINE) 50 MG tablet Take 50 mg by mouth 3 (three) times daily.     No current facility-administered medications on file prior to visit.     Allergies  Allergen Reactions  . No Known Allergies     Social History   Social History  . Marital status: Single    Spouse name: N/A  . Number of children: 0  . Years of education: 14   Occupational History  . cna    Social History Main Topics  . Smoking status: Former Smoker    Years: 1.00    Types: Cigarettes  . Smokeless tobacco: Never Used  . Alcohol use No  . Drug use: No  . Sexual activity: Yes    Partners: Male   Other Topics Concern  . 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 No pertinent surgical history.  Family History  Problem Relation Age of  Onset  . Cancer Mother   . Diabetes Mother   . Heart failure Mother   . Cancer Father   . Diabetes Father   . Heart failure Father     Review of Systems  Constitutional: Negative.  Negative for chills and fever.  HENT: Negative.   Eyes: Negative.   Respiratory: Negative.  Negative for cough and shortness of breath.   Cardiovascular: Negative.  Negative for chest pain, palpitations and leg swelling.  Gastrointestinal: Negative.   Endocrine: Negative.   Genitourinary: Negative.   Musculoskeletal: Negative.   Skin: Negative.   Allergic/Immunologic: Negative.   Neurological: Negative.   Hematological: Negative.   Psychiatric/Behavioral: Positive for sleep disturbance (snores - sometimes wakes up gasping - no daytime fatigue and no excessive sleepiness).    Objective:  BP (!) 150/90   Pulse 92   Temp 98.4 F (36.9 C) (Oral)   Resp 18   Ht 5\' 8"  (1.727 m)   Wt (!) 361 lb 9.6 oz (164 kg)   SpO2 97%   BMI 54.98 kg/m   Physical Exam  Constitutional: He is oriented to person, place, and time and well-developed, well-nourished, and in no distress.  HENT:  Head: Normocephalic and atraumatic.  Right Ear: Hearing, tympanic membrane, external ear and ear canal normal.  Left Ear: Hearing, tympanic membrane, external ear and ear canal normal.  Nose: Nose normal.  Mouth/Throat: Uvula is midline, oropharynx is clear and moist and mucous membranes are normal.  Eyes: Conjunctivae and EOM are normal. Pupils are equal, round, and reactive to light.  Neck: Trachea normal and normal range of motion. Neck supple. No thyroid mass and no thyromegaly present.  Cardiovascular: Normal rate, regular rhythm and normal heart sounds.   No murmur heard. Pulmonary/Chest: Effort normal and breath sounds normal.  Abdominal: Soft. Bowel sounds are normal. Hernia confirmed negative in the right inguinal area and confirmed negative in the left inguinal area.  Genitourinary: Testes/scrotum normal and penis  normal.  Musculoskeletal: Normal range of motion.  Neurological: He is alert and oriented to person, place, and time. Gait normal.  Skin: Skin is warm and dry.  Psychiatric: Mood, memory, affect and judgment normal.    Visual Acuity Screening   Right eye Left eye Both eyes  Without correction: 20/30 20/20 20/20-1  With correction:       Assessment and Plan :  Annual physical exam  Need for influenza vaccination - Plan: Flu Vaccine QUAD 36+ mos IM  Hyperglycemia - Plan: Hemoglobin A1c, COMPLETE METABOLIC PANEL WITH GFR - check labs patient is not sure if he as fasting at his last labs - he has strong family history of DM and wants to make sure he is not close to it - he desires to lose weight and is working towards that.  Essential hypertension - Plan: TSH, lisinopril-hydrochlorothiazide (PRINZIDE,ZESTORETIC) 20-12.5 MG tablet - increase medication because he is not at goal - he will take bid to allow the diuretic to act longer - he will continue to check his BP at home and he will recheck in 6 weeks - he potentially has sleep apnea and we will make a determination of need for sleep study in 6 months as the patient would like to try and lose weight 1st  Encounter for screening for HIV - Plan: HIV antibody - has not had STD screening in a long time - this is the only one he wants to have done today  Overweight - discussed caloric intake needs for him and what he needs o lose weight - he has been with a weight loss clinic in the past and lost weight quickly so he knows he can do it  Benny LennertSarah Weber PA-C  [facility] 08/14/2016 11:08 AM

## 2016-09-10 ENCOUNTER — Encounter: Payer: Self-pay | Admitting: Physician Assistant

## 2016-09-11 ENCOUNTER — Telehealth: Payer: Self-pay | Admitting: Family Medicine

## 2016-09-11 NOTE — Telephone Encounter (Signed)
Pt calling wanting Maralyn SagoSarah to witre a letter stating that its ok to return back to school in Jan due to his HTN pt sent an email also he wants you to email him the letter

## 2016-10-14 ENCOUNTER — Other Ambulatory Visit: Payer: Self-pay | Admitting: Physician Assistant

## 2016-10-14 DIAGNOSIS — I10 Essential (primary) hypertension: Secondary | ICD-10-CM

## 2016-11-27 ENCOUNTER — Ambulatory Visit: Payer: PRIVATE HEALTH INSURANCE

## 2017-01-01 ENCOUNTER — Other Ambulatory Visit: Payer: Self-pay | Admitting: Physician Assistant

## 2017-01-01 DIAGNOSIS — I10 Essential (primary) hypertension: Secondary | ICD-10-CM

## 2017-02-10 ENCOUNTER — Other Ambulatory Visit: Payer: Self-pay | Admitting: Physician Assistant

## 2017-02-10 DIAGNOSIS — I10 Essential (primary) hypertension: Secondary | ICD-10-CM

## 2017-02-10 NOTE — Telephone Encounter (Signed)
Pt needs an appt with me prior to him running out of the 2 week supply I have given him today.

## 2017-02-11 NOTE — Telephone Encounter (Signed)
Mychart message sent to patient about making an appointment. °

## 2017-03-10 ENCOUNTER — Other Ambulatory Visit: Payer: Self-pay | Admitting: Physician Assistant

## 2017-03-10 DIAGNOSIS — I10 Essential (primary) hypertension: Secondary | ICD-10-CM

## 2017-03-12 NOTE — Telephone Encounter (Signed)
mychart message sent to patient to call and make appt for med refills

## 2017-03-13 ENCOUNTER — Other Ambulatory Visit: Payer: Self-pay | Admitting: Physician Assistant

## 2017-03-13 DIAGNOSIS — I10 Essential (primary) hypertension: Secondary | ICD-10-CM

## 2017-03-18 ENCOUNTER — Other Ambulatory Visit: Payer: Self-pay | Admitting: Emergency Medicine

## 2017-03-18 DIAGNOSIS — I10 Essential (primary) hypertension: Secondary | ICD-10-CM

## 2017-03-18 MED ORDER — LISINOPRIL-HYDROCHLOROTHIAZIDE 20-12.5 MG PO TABS: 1.0000 | ORAL_TABLET | Freq: Two times a day (BID) | ORAL | 0 refills | Status: DC

## 2017-03-19 ENCOUNTER — Other Ambulatory Visit: Payer: Self-pay | Admitting: Emergency Medicine

## 2017-03-19 DIAGNOSIS — I10 Essential (primary) hypertension: Secondary | ICD-10-CM

## 2017-03-19 MED ORDER — LISINOPRIL-HYDROCHLOROTHIAZIDE 20-12.5 MG PO TABS: 1.0000 | ORAL_TABLET | Freq: Two times a day (BID) | ORAL | 0 refills | Status: DC

## 2017-04-13 ENCOUNTER — Ambulatory Visit: Payer: PRIVATE HEALTH INSURANCE | Admitting: Physician Assistant

## 2017-04-15 ENCOUNTER — Encounter: Payer: Self-pay | Admitting: Physician Assistant

## 2017-04-16 ENCOUNTER — Encounter: Payer: Self-pay | Admitting: Physician Assistant

## 2017-04-16 ENCOUNTER — Ambulatory Visit (INDEPENDENT_AMBULATORY_CARE_PROVIDER_SITE_OTHER): Payer: PRIVATE HEALTH INSURANCE | Admitting: Physician Assistant

## 2017-04-16 VITALS — BP 136/92 | HR 102 | Temp 98.7°F | Resp 18 | Ht 68.0 in | Wt 373.8 lb

## 2017-04-16 DIAGNOSIS — I10 Essential (primary) hypertension: Secondary | ICD-10-CM | POA: Diagnosis not present

## 2017-04-16 DIAGNOSIS — E66813 Obesity, class 3: Secondary | ICD-10-CM

## 2017-04-16 DIAGNOSIS — E1165 Type 2 diabetes mellitus with hyperglycemia: Secondary | ICD-10-CM | POA: Diagnosis not present

## 2017-04-16 DIAGNOSIS — R7309 Other abnormal glucose: Secondary | ICD-10-CM

## 2017-04-16 DIAGNOSIS — Z111 Encounter for screening for respiratory tuberculosis: Secondary | ICD-10-CM | POA: Diagnosis not present

## 2017-04-16 DIAGNOSIS — Z6841 Body Mass Index (BMI) 40.0 and over, adult: Secondary | ICD-10-CM | POA: Diagnosis not present

## 2017-04-16 DIAGNOSIS — IMO0001 Reserved for inherently not codable concepts without codable children: Secondary | ICD-10-CM

## 2017-04-16 MED ORDER — LISINOPRIL-HYDROCHLOROTHIAZIDE 20-12.5 MG PO TABS: 1.0000 | ORAL_TABLET | Freq: Two times a day (BID) | ORAL | 0 refills | Status: DC

## 2017-04-16 NOTE — Progress Notes (Signed)
Lance Neal  MRN: [id number] DOB: 1981/04/05  PCP: Mancel Bale, PA-C  Chief Complaint  Patient presents with  . xray    needs chest xray for TB test that can't get a TB skin test     Subjective:  Pt presents to clinic for TB screening.  He also needs to have his HTN medications refilled and he was hoping he could do that today also.  He has had a positive TB test while he was in school and he was treated.  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  He has been taking his HTN medications daily - he is still having some side effects but he is continuing to take them.  His BP when checked outside of the office is in the 130/80s.  He has not really changed his lifestyle but he plans on losing about 10 #s a month for the next several months because he knows he needs to.  History is obtained by patient.  Review of Systems  Constitutional: Negative for chills and fever.  Respiratory: Negative for cough and shortness of breath.   Cardiovascular: Negative for chest pain, palpitations and leg swelling.  Neurological: Negative for numbness.    Patient Active Problem List   Diagnosis Date Noted  . Hypertension 04/07/2011  . Palpitations 04/07/2011  . Class 3 severe obesity due to excess calories with body mass index (BMI) of 50.0 to 59.9 in adult [facility]) 04/07/2011    Current Outpatient Prescriptions on File Prior to Visit  Medication Sig Dispense Refill  . [DISCONTINUED] hydrALAZINE (APRESOLINE) 50 MG tablet Take 50 mg by mouth 3 (three) times daily.     No current facility-administered medications on file prior to visit.     Allergies  Allergen Reactions  . No Known Allergies     Past Medical History:  Diagnosis Date  . Hypertension   . Obesity   . 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 Smoking status: Former Smoker    Years: 1.00    Types: Cigarettes  . Smokeless tobacco: Never Used  . Alcohol use No   family history includes Cancer in his father and mother; Diabetes in his father and mother; Heart failure in his father and mother.     Objective:  BP (!) 136/92   Pulse (!) 102   Temp 98.7 F (37.1 C) (Oral)   Resp 18   Ht '5\' 8"'  (1.727 m)   Wt (!) 373 lb 12.8 oz (169.6 kg)   SpO2 97%   BMI 56.84 kg/m  Body mass index is 56.84 kg/m.  Physical Exam  Constitutional: He is oriented to person, place, and time and well-developed, well-nourished, and in no distress.  HENT:  Head: Normocephalic and atraumatic.  Right Ear: External ear normal.  Left Ear: External ear normal.  Eyes: Conjunctivae are normal.  Neck: Normal range of motion.  Cardiovascular: Normal rate, regular rhythm, normal heart sounds and intact distal pulses.   Pulmonary/Chest: Effort normal and breath sounds normal. He has no wheezes.  Musculoskeletal:       Right lower leg: He exhibits no edema.       Left lower leg: He exhibits  no edema.  Neurological: He is alert and oriented to person, place, and time. Gait normal.  Skin: Skin is warm and dry.  Psychiatric: Mood, memory, affect and judgment normal.   Wt Readings from Last 3 Encounters:  04/16/17 (!) 373 lb 12.8 oz (169.6 kg)  08/14/16 (!) 361 lb 9.6 oz (164 kg)  06/20/16 (!) 357 lb (161.9 kg)    Assessment and Plan :  Essential hypertension - Plan: CMP14+EGFR, lisinopril-hydrochlorothiazide (PRINZIDE,ZESTORETIC) 20-12.5 MG tablet - d/w pt a possible change to lisinopril/chlorthalidone for better control but pt wants to try and lose weight since he has had trouble with this medication and side effects  Screening-pulmonary TB - Plan: Quantiferon tb gold assay (blood) - needed for work  Elevated hemoglobin A1c - Plan: Hemoglobin A1c  Uncontrolled type 2 diabetes mellitus without complication, without  long-term current use of insulin ([place]) - Plan: metFORMIN (GLUCOPHAGE XR) 750 MG 24 hr tablet - this is a new diagnosis for pt he has continued to gain weight though he states that he plans on losing weight in the next 2 months -  Class 3 severe obesity due to excess calories with serious comorbidity and body mass index (BMI) of 50.0 to 59.9 in adult [facility]) - pt continues to gain weight - he is a good candidate for a weight program/medication/surgery though the patient wants to try at home 1st which I think is a great 1st step  Recheck in 3 months  Windell Hummingbird PA-C  [facility] [place] 04/17/2017 6:21 AM

## 2017-04-16 NOTE — Patient Instructions (Signed)
     IF you received an x-ray today, you will receive an invoice from [facility]. Please contact [facility] at [phone] with questions or concerns regarding your invoice.   IF you received labwork today, you will receive an invoice from [facility]. Please contact [facility] at [phone] with questions or concerns regarding your invoice.   Our billing staff will not be able to assist you with questions regarding bills from these companies.  You will be contacted with the lab results as soon as they are available. The fastest way to get your results is to activate your My Chart account. Instructions are located on the last page of this paperwork. If you have not heard from us regarding the results in 2 weeks, please contact this office.     

## 2017-04-16 NOTE — Progress Notes (Signed)
  Tuberculosis Risk Questionnaire  1. No Were you born outside the [place] in one of the following parts of the world: [place], [place], [place], [place] or [place]?    2. No Have you traveled outside the [place] and lived for more than one month in one of the following parts of the world: [place], [place], [place], [place] or [place]?    3. No Do you have a compromised immune system such as from any of the following conditions:HIV/AIDS, organ or bone marrow transplantation, diabetes, immunosuppressive medicines (e.g. Prednisone, Remicaide), leukemia, lymphoma, cancer of the head or neck, gastrectomy or jejunal bypass, end-stage renal disease (on dialysis), or silicosis?     4. Yes Home care  Have you ever or do you plan on working in: a residential care center, a health care facility, a jail or prison or homeless shelter?    5. No Have you ever: injected illegal drugs, used crack cocaine, lived in a homeless shelter  or been in jail or prison?     6. No Have you ever been exposed to anyone with infectious tuberculosis?    Tuberculosis Symptom Questionnaire  Do you currently have any of the following symptoms?  1. No Unexplained cough lasting more than 3 weeks?   2. No Unexplained fever lasting more than 3 weeks.   3. No Night Sweats (sweating that leaves the bedclothes and sheets wet)     4. No Shortness of Breath   5. No Chest Pain   6. No Unintentional weight loss    7. No Unexplained fatigue (very tired for no reason)

## 2017-04-17 LAB — CMP14+EGFR
ALBUMIN: 4.1 g/dL (ref 3.5–5.5)
ALT: 17 IU/L (ref 0–44)
AST: 21 IU/L (ref 0–40)
Albumin/Globulin Ratio: 1 — ABNORMAL LOW (ref 1.2–2.2)
Alkaline Phosphatase: 74 IU/L (ref 39–117)
BUN/Creatinine Ratio: 11 (ref 9–20)
BUN: 11 mg/dL (ref 6–20)
Bilirubin Total: 0.4 mg/dL (ref 0.0–1.2)
CALCIUM: 9.4 mg/dL (ref 8.7–10.2)
CO2: 22 mmol/L (ref 20–29)
CREATININE: 0.97 mg/dL (ref 0.76–1.27)
Chloride: 101 mmol/L (ref 96–106)
GFR calc Af Amer: 116 mL/min/{1.73_m2} (ref >59)
GFR, EST NON AFRICAN AMERICAN: 101 mL/min/{1.73_m2} (ref >59)
GLOBULIN, TOTAL: 4.2 g/dL (ref 1.5–4.5)
Glucose: 94 mg/dL (ref 65–99)
Potassium: 3.9 mmol/L (ref 3.5–5.2)
SODIUM: 139 mmol/L (ref 134–144)
Total Protein: 8.3 g/dL (ref 6.0–8.5)

## 2017-04-17 LAB — HEMOGLOBIN A1C
ESTIMATED AVERAGE GLUCOSE: 140 mg/dL
Hgb A1c MFr Bld: 6.5 % — ABNORMAL HIGH (ref 4.8–5.6)

## 2017-04-17 MED ORDER — METFORMIN HCL ER 750 MG PO TB24: 750.0000 mg | ORAL_TABLET | Freq: Every day | ORAL | 0 refills | Status: DC

## 2017-04-20 ENCOUNTER — Ambulatory Visit: Payer: PRIVATE HEALTH INSURANCE | Admitting: Physician Assistant

## 2017-04-20 LAB — QUANTIFERON IN TUBE
QFT TB AG MINUS NIL VALUE: >10 IU/mL
QUANTIFERON MITOGEN VALUE: >10 IU/mL
QUANTIFERON NIL VALUE: 0.34 [IU]/mL
QUANTIFERON TB GOLD: POSITIVE — AB

## 2017-04-20 LAB — QUANTIFERON TB GOLD ASSAY (BLOOD)

## 2017-05-18 ENCOUNTER — Ambulatory Visit: Payer: PRIVATE HEALTH INSURANCE | Admitting: Physician Assistant

## 2017-12-07 ENCOUNTER — Other Ambulatory Visit: Payer: Self-pay | Admitting: Physician Assistant

## 2017-12-07 DIAGNOSIS — I10 Essential (primary) hypertension: Secondary | ICD-10-CM

## 2017-12-28 ENCOUNTER — Telehealth: Payer: Self-pay | Admitting: Physician Assistant

## 2017-12-28 NOTE — Telephone Encounter (Signed)
If he will schedule an appt with me on Friday I can get him that documentation that day.

## 2017-12-28 NOTE — Telephone Encounter (Signed)
Transferred patient to schedule appointment

## 2017-12-28 NOTE — Telephone Encounter (Signed)
Copied from CRM [phone]. Topic: Quick Communication - See Telephone Encounter >> Dec 28, 2017  8:45 AM Arlyss Gandyichardson, Ladaysha Soutar N, NT wrote: CRM for notification. See Telephone encounter for: 12/28/17. Per Mychart message from pt he would like to see if orders can be put in for a chest xray. Mychart message reads: Hello i'd like to request a chest xray for a new job please. I've had a positive reading for exposure to tb back in 1999 and i need a new updated chest xray to clear me for new hire asap please.

## 2017-12-28 NOTE — Telephone Encounter (Signed)
Please advise 

## 2017-12-30 ENCOUNTER — Telehealth: Payer: Self-pay | Admitting: Physician Assistant

## 2017-12-30 NOTE — Telephone Encounter (Signed)
MyChart message sent to pt about changing his provider for his Friday 01/01/18 appt

## 2018-01-01 ENCOUNTER — Ambulatory Visit (INDEPENDENT_AMBULATORY_CARE_PROVIDER_SITE_OTHER): Payer: PRIVATE HEALTH INSURANCE

## 2018-01-01 ENCOUNTER — Encounter: Payer: Self-pay | Admitting: Family Medicine

## 2018-01-01 ENCOUNTER — Other Ambulatory Visit: Payer: Self-pay

## 2018-01-01 ENCOUNTER — Ambulatory Visit (INDEPENDENT_AMBULATORY_CARE_PROVIDER_SITE_OTHER): Payer: PRIVATE HEALTH INSURANCE | Admitting: Family Medicine

## 2018-01-01 VITALS — BP 138/84 | HR 103 | Temp 98.3°F | Ht 68.0 in | Wt 373.0 lb

## 2018-01-01 DIAGNOSIS — R7611 Nonspecific reaction to tuberculin skin test without active tuberculosis: Secondary | ICD-10-CM

## 2018-01-01 DIAGNOSIS — Z111 Encounter for screening for respiratory tuberculosis: Secondary | ICD-10-CM

## 2018-01-01 NOTE — Patient Instructions (Signed)
     IF you received an x-ray today, you will receive an invoice from [facility]. Please contact [facility] at [phone] with questions or concerns regarding your invoice.   IF you received labwork today, you will receive an invoice from [facility]. Please contact [facility] at [phone] with questions or concerns regarding your invoice.   Our billing staff will not be able to assist you with questions regarding bills from these companies.  You will be contacted with the lab results as soon as they are available. The fastest way to get your results is to activate your My Chart account. Instructions are located on the last page of this paperwork. If you have not heard from us regarding the results in 2 weeks, please contact this office.     

## 2018-01-01 NOTE — Progress Notes (Signed)
4/5/201910:26 AM  [place] ConstableLarry D Obey 05/12/1981, 37 y.o. male [id number]  Chief Complaint  Patient presents with  . 1999 TEST POS FOR TB, NEEDS CHEST XRAY    HPI:   Patient is a 37 y.o. male with past medical history significant for HTN, DM2, and latent TB treated in 1999 who presents today for annual TB screening for work. 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 No Known Allergies     Prior to Admission medications   Medication Sig Start Date End Date Taking? Authorizing Provider  lisinopril-hydrochlorothiazide (PRINZIDE,ZESTORETIC) 20-12.5 MG tablet Take 1 tablet by mouth 2 (two) times daily. Office visit needed for 90 day supply 12/07/17  Yes Weber, Dema SeverinSarah L, PA-C  metFORMIN (GLUCOPHAGE XR) 750 MG 24 hr tablet Take 1 tablet (750 mg total) by mouth daily with breakfast. Patient not taking: Reported on 01/01/2018 04/17/17   Morrell RiddleWeber, Sarah L, PA-C  hydrALAZINE (APRESOLINE) 50 MG tablet Take 50 mg by mouth 3 (three) times daily.  05/13/12  [provider]    Past Medical History:  Diagnosis Date  . Hypertension   . Obesity   . Palpitation     History reviewed. No pertinent surgical history.  Social History   Tobacco Use  . Smoking status: Former Smoker    Years: 1.00    Types: Cigarettes  . Smokeless tobacco: Never Used  Substance Use Topics  . Alcohol use: No    Family History  Problem Relation Age of Onset  . Cancer Mother   . Diabetes Mother   . Heart failure Mother   . Cancer Father   . Diabetes Father   . Heart failure Father     ROS Per hpi  OBJECTIVE:  Blood pressure 138/84,  pulse (!) 103, temperature 98.3 F (36.8 C), temperature source Oral, height 5\' 8"  (1.727 m), weight (!) 373 lb (169.2 kg), SpO2 97 %.  Physical Exam  Constitutional: He is oriented to person, place, and time and well-developed, well-nourished, and in no distress.  HENT:  Head: Normocephalic and atraumatic.  Mouth/Throat: Oropharynx is clear and moist.  Eyes: Pupils are equal, round, and reactive to light. EOM are normal.  Neck: Neck supple.  Cardiovascular: Normal rate and regular rhythm. Exam reveals no gallop and no friction rub.  No murmur heard. Pulmonary/Chest: Effort normal and breath sounds normal. He has no wheezes. He has no rales.  Neurological: He is alert and oriented to person, place, and time. Gait normal.  Skin: Skin is warm and dry.    Dg Chest 2 View  Result Date: 01/01/2018 CLINICAL DATA:  Tuberculosis screening. Treated for latent TB in 1999. EXAM: CHEST - 2 VIEW COMPARISON:  04/05/2011 FINDINGS: The cardiac silhouette is upper limits of normal in size. No airspace consolidation, edema, pleural effusion, or pneumothorax is identified. No acute osseous abnormality is seen. IMPRESSION: No active cardiopulmonary disease. Electronically Signed   By: Sebastian AcheAllen  Grady M.D.   On: 01/01/2018 11:12     ASSESSMENT and PLAN  1. Screening-pulmonary TB 2. Positive PPD, treated - DG Chest 2 View; Future -  negative Letter for future employer with negative TB screen given  Return for chronic  medical issues.    Myles Lipps, MD Primary Care at [facility] [address] [place], [place] [address] Ph.  [phone] Fax [phone]

## 2018-01-06 ENCOUNTER — Other Ambulatory Visit: Payer: Self-pay | Admitting: Physician Assistant

## 2018-01-06 ENCOUNTER — Encounter: Payer: Self-pay | Admitting: Physician Assistant

## 2018-01-06 DIAGNOSIS — I10 Essential (primary) hypertension: Secondary | ICD-10-CM

## 2018-01-08 ENCOUNTER — Ambulatory Visit (INDEPENDENT_AMBULATORY_CARE_PROVIDER_SITE_OTHER): Payer: PRIVATE HEALTH INSURANCE | Admitting: Family Medicine

## 2018-01-08 ENCOUNTER — Encounter: Payer: Self-pay | Admitting: Family Medicine

## 2018-01-08 ENCOUNTER — Other Ambulatory Visit: Payer: Self-pay

## 2018-01-08 VITALS — BP 140/90 | HR 97 | Temp 97.8°F | Ht 68.0 in | Wt 370.0 lb

## 2018-01-08 DIAGNOSIS — E1165 Type 2 diabetes mellitus with hyperglycemia: Secondary | ICD-10-CM | POA: Diagnosis not present

## 2018-01-08 DIAGNOSIS — I10 Essential (primary) hypertension: Secondary | ICD-10-CM | POA: Diagnosis not present

## 2018-01-08 DIAGNOSIS — Z6841 Body Mass Index (BMI) 40.0 and over, adult: Secondary | ICD-10-CM

## 2018-01-08 DIAGNOSIS — E119 Type 2 diabetes mellitus without complications: Secondary | ICD-10-CM

## 2018-01-08 DIAGNOSIS — IMO0001 Reserved for inherently not codable concepts without codable children: Secondary | ICD-10-CM

## 2018-01-08 LAB — POCT GLYCOSYLATED HEMOGLOBIN (HGB A1C): Hemoglobin A1C: 6.2

## 2018-01-08 NOTE — Progress Notes (Signed)
4/12/20198:52 AM  [place] Constable 1981-05-25, 37 y.o. male [id number]  Chief Complaint  Patient presents with  . Follow-up    Hypertension, Diabetes follow up. Has physical form for job wellness program that needs to be completed    HPI:   Patient is a 37 y.o. male with past medical history significant for HTN and DM2 who presents today for folowup on chronic medical conditions  He just started working at [employer] retirement home and they have an onset 24 hour clinic for their staff, exercise area and access to a nutritionist He would like to start doing aquatic exercise and has started to change his diet He was diagnosed with DM2 last year in July based on a1c 6.5 He has not been taking his metformin He has not seen an eye doctor, denies any vision changes He denies any burning pain, numbness, tingling of feet. He declines a foot exam today. He has no acute concerns today  a1c 03/2017 - 6.5 CMP 03/2017 - normal Lipids 05/2016 - normal  Depression screen [facility] 2/9 01/08/2018 01/01/2018 08/14/2016  Decreased Interest 0 0 0  Down, Depressed, Hopeless 0 0 0  PHQ - 2 Score 0 0 0    Allergies  Allergen Reactions  . No Known Allergies     Prior to Admission medications   Medication Sig Start Date End Date Taking? Authorizing Provider  lisinopril-hydrochlorothiazide (PRINZIDE,ZESTORETIC) 20-12.5 MG tablet TAKE 1 TABLET BY MOUTH 2 (TWO) TIMES DAILY. OFFICE VISIT NEEDED FOR 52 DAY SUPPLY 01/06/18   Valarie Cones, Dema Severin, PA-C  metFORMIN (GLUCOPHAGE XR) 750 MG 24 hr tablet Take 1 tablet (750 mg total) by mouth daily with breakfast. Patient not taking: Reported on 01/01/2018 04/17/17   Larkin Ina    Past Medical History:  Diagnosis Date  . Hypertension   . Obesity   . Palpitation     No past surgical history on file.  Social History   Tobacco Use  . Smoking status: Former Smoker    Years: 1.00    Types: Cigarettes  . Smokeless tobacco: Never Used  Substance Use Topics    . Alcohol use: No    Family History  Problem Relation Age of Onset  . Cancer Mother   . Diabetes Mother   . Heart failure Mother   . Cancer Father   . Diabetes Father   . Heart failure Father     Review of Systems  Constitutional: Negative for chills, fever and malaise/fatigue.  Eyes: Negative for blurred vision and double vision.  Respiratory: Negative for cough and shortness of breath.   Cardiovascular: Negative for chest pain, palpitations and leg swelling.  Gastrointestinal: Negative for abdominal pain, nausea and vomiting.  Genitourinary: Negative for frequency and urgency.  Neurological: Negative for tingling and sensory change.  Endo/Heme/Allergies: Negative for polydipsia.     OBJECTIVE:  Blood pressure 140/90, pulse 97, temperature 97.8 F (36.6 C), temperature source Oral, height 5\' 8"  (1.727 m), weight (!) 370 lb (167.8 kg), SpO2 97 %.  Wt Readings from Last 3 Encounters:  01/08/18 (!) 370 lb (167.8 kg)  01/01/18 (!) 373 lb (169.2 kg)  04/16/17 (!) 373 lb 12.8 oz (169.6 kg)    Physical Exam  Constitutional: He is oriented to person, place, and time.  HENT:  Head: Normocephalic and atraumatic.  Mouth/Throat: Oropharynx is clear and moist.  Eyes: Pupils are equal, round, and reactive to light. EOM are normal.  Neck: Neck supple.  Cardiovascular: Normal rate and regular  rhythm. Exam reveals no gallop and no friction rub.  No murmur heard. Pulmonary/Chest: Effort normal and breath sounds normal. He has no wheezes. He has no rales.  Neurological: He is alert and oriented to person, place, and time.  Skin: Skin is warm and dry.    Results for orders placed or performed in visit on 01/08/18 (from the past 24 hour(s))  POCT glycosylated hemoglobin (Hb A1C)     Status: None   Collection Time: 01/08/18  9:18 AM  Result Value Ref Range   Hemoglobin A1C 6.2      ASSESSMENT and PLAN 1. Essential hypertension [place] goal, however patient engaging in  LFM, will continue current medications. Re-evaluate at next visit. - Comprehensive metabolic panel - TSH  2. Controlled DM2 without complications, without long-term use of insulin At goal with dietary changes cont working on LFM and weight loss.  - POCT glycosylated hemoglobin (Hb A1C) - Microalbumin/Creatinine Ratio, Urine - Ambulatory referral to Ophthalmology  3. Obesity, class 3, BMI > 50 Patient engaged in LFM, cont with healthier diet and increasing physical activity.   Return in about 3 months (around 04/09/2018) for weight/blood pressure.    Myles LippsIrma M Santiago, MD Primary Care at [facility] [address] [place], [place] [address] Ph.  [phone] Fax [phone]

## 2018-01-08 NOTE — Patient Instructions (Signed)
     IF you received an x-ray today, you will receive an invoice from [facility]. Please contact [facility] at [phone] with questions or concerns regarding your invoice.   IF you received labwork today, you will receive an invoice from [facility]. Please contact [facility] at [phone] with questions or concerns regarding your invoice.   Our billing staff will not be able to assist you with questions regarding bills from these companies.  You will be contacted with the lab results as soon as they are available. The fastest way to get your results is to activate your My Chart account. Instructions are located on the last page of this paperwork. If you have not heard from us regarding the results in 2 weeks, please contact this office.     

## 2018-01-09 LAB — TSH: TSH: 1.01 u[IU]/mL (ref 0.450–4.500)

## 2018-01-09 LAB — COMPREHENSIVE METABOLIC PANEL
ALT: 24 IU/L (ref 0–44)
AST: 28 IU/L (ref 0–40)
Albumin/Globulin Ratio: 1 — ABNORMAL LOW (ref 1.2–2.2)
Albumin: 4 g/dL (ref 3.5–5.5)
Alkaline Phosphatase: 70 IU/L (ref 39–117)
BUN/Creatinine Ratio: 13 (ref 9–20)
BUN: 11 mg/dL (ref 6–20)
Bilirubin Total: 0.5 mg/dL (ref 0.0–1.2)
CO2: 24 mmol/L (ref 20–29)
Calcium: 8.9 mg/dL (ref 8.7–10.2)
Chloride: 102 mmol/L (ref 96–106)
Creatinine, Ser: 0.87 mg/dL (ref 0.76–1.27)
GFR calc Af Amer: 128 mL/min/{1.73_m2} (ref >59)
GFR calc non Af Amer: 111 mL/min/{1.73_m2} (ref >59)
Globulin, Total: 3.9 g/dL (ref 1.5–4.5)
Glucose: 127 mg/dL — ABNORMAL HIGH (ref 65–99)
Potassium: 3.8 mmol/L (ref 3.5–5.2)
Sodium: 141 mmol/L (ref 134–144)
Total Protein: 7.9 g/dL (ref 6.0–8.5)

## 2018-01-09 LAB — MICROALBUMIN / CREATININE URINE RATIO
Creatinine, Urine: 200.1 mg/dL
Microalb/Creat Ratio: 3 mg/g creat (ref 0.0–30.0)
Microalbumin, Urine: 6 ug/mL

## 2018-01-11 ENCOUNTER — Encounter: Payer: Self-pay | Admitting: Physician Assistant

## 2018-01-11 ENCOUNTER — Ambulatory Visit (INDEPENDENT_AMBULATORY_CARE_PROVIDER_SITE_OTHER): Payer: PRIVATE HEALTH INSURANCE | Admitting: Physician Assistant

## 2018-01-11 ENCOUNTER — Other Ambulatory Visit: Payer: Self-pay

## 2018-01-11 VITALS — BP 116/86 | HR 93 | Temp 98.0°F | Ht 68.5 in | Wt 368.8 lb

## 2018-01-11 DIAGNOSIS — R197 Diarrhea, unspecified: Secondary | ICD-10-CM | POA: Diagnosis not present

## 2018-01-11 DIAGNOSIS — R112 Nausea with vomiting, unspecified: Secondary | ICD-10-CM

## 2018-01-11 MED ORDER — ONDANSETRON 8 MG PO TBDP: 8.0000 mg | ORAL_TABLET | Freq: Three times a day (TID) | ORAL | 0 refills | Status: AC | PRN

## 2018-01-11 NOTE — Progress Notes (Signed)
[place] ConstableLarry D Sippel  MRN: [id number] DOB: 01/24/1981  PCP: Myles LippsSantiago, Irma M, MD  Subjective:  Pt is a 37 year old male who presents to clinic for diarrhea and nausea. Onset of diarrhea was 2 days ago. Diarrhea is occurring approximately 10 times per day yesterday and 5 times/day today. enodrses feeling woozy, weak, vomiting.  Patient describes diarrhea as watery. Diarrhea has been associated with suspicious food/drink [address] [employer]. Patient denies blood in stool, recent antibiotic use, recent camping, significant abdominal pain. Previous visits for diarrhea: none. Evaluation to date: none. Treatment to date: he has been eating crackers, apples, banana, oatmeal. HA is improving. He is keeping fluids down okay today.  [facility] [employer] Another employee also got sick with similar symptoms.   Review of Systems  Constitutional: Negative for chills, diaphoresis, fatigue and fever.  Gastrointestinal: Positive for abdominal pain, diarrhea and nausea. Negative for blood in stool and vomiting.  Neurological: Negative for dizziness, light-headedness and headaches.    Patient Active Problem List   Diagnosis Date Noted  . Uncontrolled type 2 diabetes mellitus without complication, without long-term current use of insulin (HCC) 01/08/2018  . Hypertension 04/07/2011  . Palpitations 04/07/2011  . Class 3 severe obesity due to excess calories with body mass index (BMI) of 50.0 to 59.9 in adult [facility](HCC) 04/07/2011    Current Outpatient Medications on File Prior to Visit  Medication Sig Dispense Refill  . vitamin B-12 (CYANOCOBALAMIN) 100 MCG tablet Take 100 mcg by mouth daily.    Marland Kitchen. lisinopril-hydrochlorothiazide (PRINZIDE,ZESTORETIC) 20-12.5 MG tablet TAKE 1 TABLET BY MOUTH 2 (TWO) TIMES DAILY. OFFICE VISIT NEEDED FOR 90 DAY SUPPLY 60 tablet 0  . metFORMIN (GLUCOPHAGE XR) 750 MG 24 hr tablet Take 1 tablet (750 mg total) by mouth daily with breakfast. (Patient not taking: Reported on  01/01/2018) 90 tablet 0   No current facility-administered medications on file prior to visit.     Allergies  Allergen Reactions  . No Known Allergies      Objective:  BP 116/86   Pulse 93   Temp 98 F (36.7 C) (Oral)   Ht 5' 8.5" (1.74 m)   Wt (!) 368 lb 12.8 oz (167.3 kg)   SpO2 96%   BMI 55.26 kg/m   Physical Exam  Constitutional: He is oriented to person, place, and time. He appears well-developed and well-nourished.  Cardiovascular: Normal rate and regular rhythm.  Pulmonary/Chest: Effort normal. No respiratory distress.  Abdominal: Soft. Normal appearance and bowel sounds are normal. There is generalized tenderness. There is no rigidity and no guarding.  Neurological: He is alert and oriented to person, place, and time.  Skin: Skin is warm and dry.  Psychiatric: He has a normal mood and affect. His behavior is normal. Judgment and thought content normal.  Vitals reviewed.   Assessment and Plan :  1. Nausea and vomiting, intractability of vomiting not specified, unspecified vomiting type 2. Diarrhea, unspecified type - GI Profile, Stool, PCR - ondansetron (ZOFRAN-ODT) 8 MG disintegrating tablet; Take 1 tablet (8 mg total) by mouth every 8 (eight) hours as needed for nausea.  Dispense: 12 tablet; Refill: 0 - Pt presents with diarrhea x 3 days. Clinically he is improving and his vitals are stable. Suspect viral gastroenteritis 2/2 food. Plan to treat supportively. He will return stool sample if symptoms fail to improve. RTC PRN. He understands and agrees with plan.    Marco CollieWhitney Laylamarie Meuser, PA-C  Primary Care at [facility] 01/11/2018 3:39 PM

## 2018-01-11 NOTE — Patient Instructions (Addendum)
See below for the diet to keep until you get better.  Please provide a stool sample if you fail to improve.   Food Choices to Help Relieve Diarrhea, Adult When you have diarrhea, the foods you eat and your eating habits are very important. Choosing the right foods and drinks can help:  Relieve diarrhea.  Replace lost fluids and nutrients.  Prevent dehydration.  What general guidelines should I follow? Relieving diarrhea  Choose foods with less than 2 g or .07 oz. of fiber per serving.  Limit fats to less than 8 tsp (38 g or 1.34 oz.) a day.  Avoid the following: ? Foods and beverages sweetened with high-fructose corn syrup, honey, or sugar alcohols such as xylitol, sorbitol, and mannitol. ? Foods that contain a lot of fat or sugar. ? Fried, greasy, or spicy foods. ? High-fiber grains, breads, and cereals. ? Raw fruits and vegetables.  Eat foods that are rich in probiotics. These foods include dairy products such as yogurt and fermented milk products. They help increase healthy bacteria in the stomach and intestines (gastrointestinal tract, or GI tract).  If you have lactose intolerance, avoid dairy products. These may make your diarrhea worse.  Take medicine to help stop diarrhea (antidiarrheal medicine) only as told by your health care provider. Replacing nutrients  Eat small meals or snacks every 3-4 hours.  Eat bland foods, such as white rice, toast, or baked potato, until your diarrhea starts to get better. Gradually reintroduce nutrient-rich foods as tolerated or as told by your health care provider. This includes: ? Well-cooked protein foods. ? Peeled, seeded, and soft-cooked fruits and vegetables. ? Low-fat dairy products.  Take vitamin and mineral supplements as told by your health care provider. Preventing dehydration   Start by sipping water or a special solution to prevent dehydration (oral rehydration solution, ORS). Urine that is clear or pale yellow means  that you are getting enough fluid.  Try to drink at least 8-10 cups of fluid each day to help replace lost fluids.  You may add other liquids in addition to water, such as clear juice or decaffeinated sports drinks, as tolerated or as told by your health care provider.  Avoid drinks with caffeine, such as coffee, tea, or soft drinks.  Avoid alcohol. What foods are recommended? The items listed may not be a complete list. Talk with your health care provider about what dietary choices are best for you. Grains White rice. White, [place], or pita breads (fresh or toasted), including plain rolls, buns, or bagels. White pasta. Saltine, soda, or graham crackers. Pretzels. Low-fiber cereal. Cooked cereals made with water (such as cornmeal, farina, or cream cereals). Plain muffins. Matzo. Melba toast. Zwieback. Vegetables Potatoes (without the skin). Most well-cooked and canned vegetables without skins or seeds. Tender lettuce. Fruits Apple sauce. Fruits canned in juice. Cooked apricots, cherries, grapefruit, peaches, pears, or plums. Fresh bananas and cantaloupe. Meats and other protein foods Baked or boiled chicken. Eggs. Tofu. Fish. Seafood. Smooth nut butters. Ground or well-cooked tender beef, ham, veal, lamb, pork, or poultry. Dairy Plain yogurt, kefir, and unsweetened liquid yogurt. Lactose-free milk, buttermilk, skim milk, or soy milk. Low-fat or nonfat hard cheese. Beverages Water. Low-calorie sports drinks. Fruit juices without pulp. Strained tomato and vegetable juices. Decaffeinated teas. Sugar-free beverages not sweetened with sugar alcohols. Oral rehydration solutions, if approved by your health care provider. Seasoning and other foods Bouillon, broth, or soups made from recommended foods. What foods are not recommended? The items listed may  not be a complete list. Talk with your health care provider about what dietary choices are best for you. Grains Whole grain, whole wheat, bran,  or rye breads, rolls, pastas, and crackers. Wild or brown rice. Whole grain or bran cereals. Barley. Oats and oatmeal. Corn tortillas or taco shells. Granola. Popcorn. Vegetables Raw vegetables. Fried vegetables. Cabbage, broccoli, Brussels sprouts, artichokes, baked beans, beet greens, corn, kale, legumes, peas, sweet potatoes, and yams. Potato skins. Cooked spinach and cabbage. Fruits Dried fruit, including raisins and dates. Raw fruits. Stewed or dried prunes. Canned fruits with syrup. Meat and other protein foods Fried or fatty meats. Deli meats. Chunky nut butters. Nuts and seeds. Beans and lentils. Berniece Salines. Hot dogs. Sausage. Dairy High-fat cheeses. Whole milk, chocolate milk, and beverages made with milk, such as milk shakes. Half-and-half. Cream. sour cream. Ice cream. Beverages Caffeinated beverages (such as coffee, tea, soda, or energy drinks). Alcoholic beverages. Fruit juices with pulp. Prune juice. Soft drinks sweetened with high-fructose corn syrup or sugar alcohols. High-calorie sports drinks. Fats and oils Butter. Cream sauces. Margarine. Salad oils. Plain salad dressings. Olives. Avocados. Mayonnaise. Sweets and desserts Sweet rolls, doughnuts, and sweet breads. Sugar-free desserts sweetened with sugar alcohols such as xylitol and sorbitol. Seasoning and other foods Honey. Hot sauce. Chili powder. Gravy. Cream-based or milk-based soups. Pancakes and waffles. Summary  When you have diarrhea, the foods you eat and your eating habits are very important.  Make sure you get at least 8-10 cups of fluid each day, or enough to keep your urine clear or pale yellow.  Eat bland foods and gradually reintroduce healthy, nutrient-rich foods as tolerated, or as told by your health care provider.  Avoid high-fiber, fried, greasy, or spicy foods. This information is not intended to replace advice given to you by your health care provider. Make sure you discuss any questions you have with your  health care provider. Document Released: 12/06/2003 Document Revised: 09/12/2016 Document Reviewed: 09/12/2016 Elsevier Interactive Patient Education  [employer].  Thank you for coming in today. I hope you feel we met your needs.  Feel free to call PCP if you have any questions or further requests.  Please consider signing up for MyChart if you do not already have it, as this is a great way to communicate with me.  Best,  Whitney McVey, PA-C  IF you received an x-ray today, you will receive an invoice from [facility]. Please contact [facility] at [phone] with questions or concerns regarding your invoice.   IF you received labwork today, you will receive an invoice from [facility]. Please contact [facility] at [phone] with questions or concerns regarding your invoice.   Our billing staff will not be able to assist you with questions regarding bills from these companies.  You will be contacted with the lab results as soon as they are available. The fastest way to get your results is to activate your My Chart account. Instructions are located on the last page of this paperwork. If you have not heard from [place] regarding the results in 2 weeks, please contact this office.

## 2018-02-09 ENCOUNTER — Encounter: Payer: Self-pay | Admitting: Emergency Medicine

## 2018-02-09 ENCOUNTER — Other Ambulatory Visit: Payer: Self-pay

## 2018-02-09 ENCOUNTER — Ambulatory Visit (INDEPENDENT_AMBULATORY_CARE_PROVIDER_SITE_OTHER): Payer: PRIVATE HEALTH INSURANCE | Admitting: Emergency Medicine

## 2018-02-09 VITALS — BP 130/108 | HR 82 | Temp 98.9°F | Resp 16 | Ht 68.75 in | Wt 372.6 lb

## 2018-02-09 DIAGNOSIS — E119 Type 2 diabetes mellitus without complications: Secondary | ICD-10-CM

## 2018-02-09 DIAGNOSIS — R202 Paresthesia of skin: Secondary | ICD-10-CM | POA: Diagnosis not present

## 2018-02-09 DIAGNOSIS — I10 Essential (primary) hypertension: Secondary | ICD-10-CM

## 2018-02-09 MED ORDER — METFORMIN HCL 500 MG PO TABS: 500.0000 mg | ORAL_TABLET | Freq: Two times a day (BID) | ORAL | 3 refills | Status: AC

## 2018-02-09 NOTE — Progress Notes (Signed)
[place] Con[place] 37 y.o.   Chief Complaint  Patient presents with  . Numbness    x 2 days in BOTH ARMS and LEGS with tingling    HISTORY OF PRESENT ILLNESS: This is a 37 y.o. male complaining of numbness and tingling to both hands for the past 5 days.  Intermittent symptoms not associated with any other findings.  Denies trauma.  Denies occupational repetitive use of hands.  Has a history of hypertension and diabetes.  HPI   Prior to Admission medications   Medication Sig Start Date End Date Taking? Authorizing Provider  lisinopril-hydrochlorothiazide (PRINZIDE,ZESTORETIC) 20-12.5 MG tablet TAKE 1 TABLET BY MOUTH 2 (TWO) TIMES DAILY. OFFICE VISIT NEEDED FOR 64 DAY SUPPLY 01/06/18  Yes Weber, Dema Severin, PA-C  Multiple Vitamin (MULTIVITAMIN) tablet Take 1 tablet by mouth daily.   Yes [provider]  vitamin B-12 (CYANOCOBALAMIN) 100 MCG tablet Take 100 mcg by mouth daily.   Yes [provider]  metFORMIN (GLUCOPHAGE XR) 750 MG 24 hr tablet Take 1 tablet (750 mg total) by mouth daily with breakfast. Patient not taking: Reported on 01/01/2018 04/17/17   Valarie Cones, Dema Severin, PA-C  ondansetron (ZOFRAN-ODT) 8 MG disintegrating tablet Take 1 tablet (8 mg total) by mouth every 8 (eight) hours as needed for nausea. Patient not taking: Reported on 02/09/2018 01/11/18   McVey, Madelaine Bhat, PA-C    Allergies  Allergen Reactions  . No Known Allergies     Patient Active Problem List   Diagnosis Date Noted  . Uncontrolled type 2 diabetes mellitus without complication, without long-term current use of insulin (HCC) 01/08/2018  . Hypertension 04/07/2011  . Palpitations 04/07/2011  . Class 3 severe obesity due to excess calories with body mass index (BMI) of 50.0 to 59.9 in adult [facility]) 04/07/2011    Past Medical History:  Diagnosis Date  . Hypertension   . Obesity   . Palpitation     No past surgical history on file.  Social History   Socioeconomic History  . Marital  status: Single    Spouse name: Not on file  . Number of children: 0  . Years of education: 31  . Highest education level: Not on file  Occupational History  . Occupation: cna  Social Needs  . Financial resource strain: Not on file  . Food insecurity:    Worry: Not on file    Inability: Not on file  . Transportation needs:    Medical: Not on file    Non-medical: Not on file  Tobacco Use  . Smoking status: Former Smoker    Years: 1.00    Types: Cigarettes  . Smokeless tobacco: Never Used  Substance and Sexual Activity  . Alcohol use: No  . Drug use: No  . Sexual activity: Yes    Partners: Male  Lifestyle  . Physical activity:    Days per week: Not on file    Minutes per session: Not on file  . Stress: Not on file  Relationships  . Social connections:    Talks on phone: Not on file    Gets together: Not on file    Attends religious service: Not on file    Active member of club or organization: Not on file    Attends meetings of clubs or organizations: Not on file    Relationship status: Not on file  . Intimate partner violence:    Fear of current or ex partner: Not on file    Emotionally abused: Not  on file    Physically abused: Not on file    Forced sexual activity: Not on file  Other Topics Concern  . 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 Cancer Mother   . Diabetes Mother   . Heart failure Mother   . Cancer Father   . Diabetes Father   . Heart failure Father      Review of Systems  Constitutional: Negative.  Negative for chills and fever.  HENT: Negative.  Negative for sore throat.   Eyes: Negative.  Negative for blurred vision and double vision.  Respiratory: Negative.  Negative for cough and shortness of breath.   Cardiovascular: Negative.  Negative for chest pain  and palpitations.  Gastrointestinal: Negative for abdominal pain, nausea and vomiting.  Genitourinary: Negative.  Negative for dysuria.  Musculoskeletal: Negative.  Negative for myalgias and neck pain.  Skin: Negative.  Negative for rash.  Neurological: Positive for sensory change. Negative for dizziness and headaches.  Endo/Heme/Allergies: Negative.     Vitals:   02/09/18 0918  BP: (!) 142/100  Pulse: 82  Resp: 16  Temp: 98.9 F (37.2 C)  SpO2: 97%    Physical Exam  Constitutional: He is oriented to person, place, and time. He appears well-developed.  HENT:  Head: Normocephalic and atraumatic.  Nose: Nose normal.  Mouth/Throat: Oropharynx is clear and moist.  Eyes: Pupils are equal, round, and reactive to light. Conjunctivae and EOM are normal.  Neck: Normal range of motion. Neck supple. No JVD present.  Cardiovascular: Normal rate, regular rhythm, normal heart sounds and intact distal pulses.  Pulmonary/Chest: Effort normal and breath sounds normal.  Abdominal: Soft. Bowel sounds are normal. He exhibits no distension. There is no tenderness.  Musculoskeletal: Normal range of motion. He exhibits no edema or tenderness.  Lymphadenopathy:    He has no cervical adenopathy.  Neurological: He is alert and oriented to person, place, and time. No sensory deficit. He exhibits normal muscle tone. Coordination normal.  Skin: Skin is warm and dry. Capillary refill takes less than 2 seconds.  Psychiatric: He has a normal mood and affect. His behavior is normal.  Vitals reviewed.  Paresthesia of both hands Could be a manifestation of a peripheral neuropathy secondary to diabetes.  Recommend that he starts metformin 500 mg twice a day.  He has never taken it.  Also recommend a neurology evaluation for possible nerve conduction testing.  A total of 25 minutes was spent in the room with the patient, greater than 50% of which was in counseling/coordination of care regarding differential  diagnosis, management, medication, and neurology follow-up.   ASSESSMENT & PLAN: Cherry was seen today for numbness.  Diagnoses and all orders for this visit:  Paresthesia of both hands -     metFORMIN (GLUCOPHAGE) 500 MG tablet; Take 1 tablet (500 mg total) by mouth 2 (two) times daily with a meal. -     Ambulatory referral to Neurology  Controlled type 2 diabetes mellitus without complication, without long-term current use of insulin (HCC)  Essential hypertension    Patient Instructions       IF you received an x-ray today, you will receive an invoice from [facility]. Please contact [facility] at [phone]  with questions or concerns regarding your invoice.   IF you received labwork today, you will receive an invoice from [facility]. Please contact [facility] at [phone] with questions or concerns regarding your invoice.   Our billing staff will not be able to assist you with questions regarding bills from these companies.  You will be contacted with the lab results as soon as they are available. The fastest way to get your results is to activate your My Chart account. Instructions are located on the last page of this paperwork. If you have not heard from [place] regarding the results in 2 weeks, please contact this office.     Diabetes Mellitus and Nutrition When you have diabetes (diabetes mellitus), it is very important to have healthy eating habits because your blood sugar (glucose) levels are greatly affected by what you eat and drink. Eating healthy foods in the appropriate amounts, at about the same times every day, can help you:  Control your blood glucose.  Lower your risk of heart disease.  Improve your blood pressure.  Reach or maintain a healthy weight.  Every person with diabetes is different, and each person has different needs for a meal plan. Your health care provider may recommend that you work with a diet and nutrition specialist  (dietitian) to make a meal plan that is best for you. Your meal plan may vary depending on factors such as:  The calories you need.  The medicines you take.  Your weight.  Your blood glucose, blood pressure, and cholesterol levels.  Your activity level.  Other health conditions you have, such as heart or kidney disease.  How do carbohydrates affect me? Carbohydrates affect your blood glucose level more than any other type of food. Eating carbohydrates naturally increases the amount of glucose in your blood. Carbohydrate counting is a method for keeping track of how many carbohydrates you eat. Counting carbohydrates is important to keep your blood glucose at a healthy level, especially if you use insulin or take certain oral diabetes medicines. It is important to know how many carbohydrates you can safely have in each meal. This is different for every person. Your dietitian can help you calculate how many carbohydrates you should have at each meal and for snack. Foods that contain carbohydrates include:  Bread, cereal, rice, pasta, and crackers.  Potatoes and corn.  Peas, beans, and lentils.  Milk and yogurt.  Fruit and juice.  Desserts, such as cakes, cookies, ice cream, and candy.  How does alcohol affect me? Alcohol can cause a sudden decrease in blood glucose (hypoglycemia), especially if you use insulin or take certain oral diabetes medicines. Hypoglycemia can be a life-threatening condition. Symptoms of hypoglycemia (sleepiness, dizziness, and confusion) are similar to symptoms of having too much alcohol. If your health care provider says that alcohol is safe for you, follow these guidelines:  Limit alcohol intake to no more than 1 drink per day for nonpregnant women and 2 drinks per day for men. One drink equals 12 oz of beer, 5 oz of wine, or 1 oz of hard liquor.  Do not drink on an empty stomach.  Keep yourself hydrated with water, diet soda, or unsweetened iced  tea.  Keep in mind that regular soda, juice, and other mixers may contain a lot of sugar and must be counted as carbohydrates.  What are tips for following this plan? Reading food labels  Start by checking the serving size on the label. The amount of calories, carbohydrates, fats, and  other nutrients listed on the label are based on one serving of the food. Many foods contain more than one serving per package.  Check the total grams (g) of carbohydrates in one serving. You can calculate the number of servings of carbohydrates in one serving by dividing the total carbohydrates by 15. For example, if a food has 30 g of total carbohydrates, it would be equal to 2 servings of carbohydrates.  Check the number of grams (g) of saturated and trans fats in one serving. Choose foods that have low or no amount of these fats.  Check the number of milligrams (mg) of sodium in one serving. Most people should limit total sodium intake to less than 2,300 mg per day.  Always check the nutrition information of foods labeled as "low-fat" or "nonfat". These foods may be higher in added sugar or refined carbohydrates and should be avoided.  Talk to your dietitian to identify your daily goals for nutrients listed on the label. Shopping  Avoid buying canned, premade, or processed foods. These foods tend to be high in fat, sodium, and added sugar.  Shop around the outside edge of the grocery store. This includes fresh fruits and vegetables, bulk grains, fresh meats, and fresh dairy. Cooking  Use low-heat cooking methods, such as baking, instead of high-heat cooking methods like deep frying.  Cook using healthy oils, such as olive, canola, or sunflower oil.  Avoid cooking with butter, cream, or high-fat meats. Meal planning  Eat meals and snacks regularly, preferably at the same times every day. Avoid going long periods of time without eating.  Eat foods high in fiber, such as fresh fruits, vegetables,  beans, and whole grains. Talk to your dietitian about how many servings of carbohydrates you can eat at each meal.  Eat 4-6 ounces of lean protein each day, such as lean meat, chicken, fish, eggs, or tofu. 1 ounce is equal to 1 ounce of meat, chicken, or fish, 1 egg, or 1/4 cup of tofu.  Eat some foods each day that contain healthy fats, such as avocado, nuts, seeds, and fish. Lifestyle   Check your blood glucose regularly.  Exercise at least 30 minutes 5 or more days each week, or as told by your health care provider.  Take medicines as told by your health care provider.  Do not use any products that contain nicotine or tobacco, such as cigarettes and e-cigarettes. If you need help quitting, ask your health care provider.  Work with a Veterinary surgeon or diabetes educator to identify strategies to manage stress and any emotional and social challenges. What are some questions to ask my health care provider?  Do I need to meet with a diabetes educator?  Do I need to meet with a dietitian?  What number can I call if I have questions?  When are the best times to check my blood glucose? Where to find more information:  American Diabetes Association: diabetes.org/food-and-fitness/food  Academy of Nutrition and Dietetics: https://www.vargas.com/  [employer] of Diabetes and Digestive and Kidney Diseases (NIH): FindJewelers.cz Summary  A healthy meal plan will help you control your blood glucose and maintain a healthy lifestyle.  Working with a diet and nutrition specialist (dietitian) can help you make a meal plan that is best for you.  Keep in mind that carbohydrates and alcohol have immediate effects on your blood glucose levels. It is important to count carbohydrates and to use alcohol carefully. This information is not intended to replace advice given to you  by your  health care provider. Make sure you discuss any questions you have with your health care provider. Document Released: 06/12/2005 Document Revised: 10/20/2016 Document Reviewed: 10/20/2016 Elsevier Interactive Patient Education  2018 [employer].  Paresthesia Paresthesia is a burning or prickling feeling. This feeling can happen in any part of the body. It often happens in the hands, arms, legs, or feet. Usually, it is not painful. In most cases, the feeling goes away in a short time and is not a sign of a serious problem. Follow these instructions at home:  Avoid drinking alcohol.  Try massage or needle therapy (acupuncture) to help with your problems.  Keep all follow-up visits as told by your doctor. This is important. Contact a doctor if:  You keep on having episodes of paresthesia.  Your burning or prickling feeling gets worse when you walk.  You have pain or cramps.  You feel dizzy.  You have a rash. Get help right away if:  You feel weak.  You have trouble walking or moving.  You have problems speaking, understanding, or seeing.  You feel confused.  You cannot control when you pee (urinate) or poop (bowel movement).  You lose feeling (numbness) after an injury.  You pass out (faint). This information is not intended to replace advice given to you by your health care provider. Make sure you discuss any questions you have with your health care provider. Document Released: 08/28/2008 Document Revised: 02/21/2016 Document Reviewed: 09/11/2014 Elsevier Interactive Patient Education  2018 Elsevier Inc.       Edwina Barth, MD [facility]

## 2018-02-09 NOTE — Patient Instructions (Addendum)
   IF you received an x-ray today, you will receive an invoice from [facility]. Please contact [facility] at [phone] with questions or concerns regarding your invoice.   IF you received labwork today, you will receive an invoice from [facility]. Please contact [facility] at [phone] with questions or concerns regarding your invoice.   Our billing staff will not be able to assist you with questions regarding bills from these companies.  You will be contacted with the lab results as soon as they are available. The fastest way to get your results is to activate your My Chart account. Instructions are located on the last page of this paperwork. If you have not heard from us regarding the results in 2 weeks, please contact this office.     Diabetes Mellitus and Nutrition When you have diabetes (diabetes mellitus), it is very important to have healthy eating habits because your blood sugar (glucose) levels are greatly affected by what you eat and drink. Eating healthy foods in the appropriate amounts, at about the same times every day, can help you:  Control your blood glucose.  Lower your risk of heart disease.  Improve your blood pressure.  Reach or maintain a healthy weight.  Every person with diabetes is different, and each person has different needs for a meal plan. Your health care provider may recommend that you work with a diet and nutrition specialist (dietitian) to make a meal plan that is best for you. Your meal plan may vary depending on factors such as:  The calories you need.  The medicines you take.  Your weight.  Your blood glucose, blood pressure, and cholesterol levels.  Your activity level.  Other health conditions you have, such as heart or kidney disease.  How do carbohydrates affect me? Carbohydrates affect your blood glucose level more than any other type of food. Eating carbohydrates naturally increases the amount of glucose in your  blood. Carbohydrate counting is a method for keeping track of how many carbohydrates you eat. Counting carbohydrates is important to keep your blood glucose at a healthy level, especially if you use insulin or take certain oral diabetes medicines. It is important to know how many carbohydrates you can safely have in each meal. This is different for every person. Your dietitian can help you calculate how many carbohydrates you should have at each meal and for snack. Foods that contain carbohydrates include:  Bread, cereal, rice, pasta, and crackers.  Potatoes and corn.  Peas, beans, and lentils.  Milk and yogurt.  Fruit and juice.  Desserts, such as cakes, cookies, ice cream, and candy.  How does alcohol affect me? Alcohol can cause a sudden decrease in blood glucose (hypoglycemia), especially if you use insulin or take certain oral diabetes medicines. Hypoglycemia can be a life-threatening condition. Symptoms of hypoglycemia (sleepiness, dizziness, and confusion) are similar to symptoms of having too much alcohol. If your health care provider says that alcohol is safe for you, follow these guidelines:  Limit alcohol intake to no more than 1 drink per day for nonpregnant women and 2 drinks per day for men. One drink equals 12 oz of beer, 5 oz of wine, or 1 oz of hard liquor.  Do not drink on an empty stomach.  Keep yourself hydrated with water, diet soda, or unsweetened iced tea.  Keep in mind that regular soda, juice, and other mixers may contain a lot of sugar and must be counted as carbohydrates.  What are tips for following   this plan? Reading food labels  Start by checking the serving size on the label. The amount of calories, carbohydrates, fats, and other nutrients listed on the label are based on one serving of the food. Many foods contain more than one serving per package.  Check the total grams (g) of carbohydrates in one serving. You can calculate the number of servings of  carbohydrates in one serving by dividing the total carbohydrates by 15. For example, if a food has 30 g of total carbohydrates, it would be equal to 2 servings of carbohydrates.  Check the number of grams (g) of saturated and trans fats in one serving. Choose foods that have low or no amount of these fats.  Check the number of milligrams (mg) of sodium in one serving. Most people should limit total sodium intake to less than 2,300 mg per day.  Always check the nutrition information of foods labeled as "low-fat" or "nonfat". These foods may be higher in added sugar or refined carbohydrates and should be avoided.  Talk to your dietitian to identify your daily goals for nutrients listed on the label. Shopping  Avoid buying canned, premade, or processed foods. These foods tend to be high in fat, sodium, and added sugar.  Shop around the outside edge of the grocery store. This includes fresh fruits and vegetables, bulk grains, fresh meats, and fresh dairy. Cooking  Use low-heat cooking methods, such as baking, instead of high-heat cooking methods like deep frying.  Cook using healthy oils, such as olive, canola, or sunflower oil.  Avoid cooking with butter, cream, or high-fat meats. Meal planning  Eat meals and snacks regularly, preferably at the same times every day. Avoid going long periods of time without eating.  Eat foods high in fiber, such as fresh fruits, vegetables, beans, and whole grains. Talk to your dietitian about how many servings of carbohydrates you can eat at each meal.  Eat 4-6 ounces of lean protein each day, such as lean meat, chicken, fish, eggs, or tofu. 1 ounce is equal to 1 ounce of meat, chicken, or fish, 1 egg, or 1/4 cup of tofu.  Eat some foods each day that contain healthy fats, such as avocado, nuts, seeds, and fish. Lifestyle   Check your blood glucose regularly.  Exercise at least 30 minutes 5 or more days each week, or as told by your health care  provider.  Take medicines as told by your health care provider.  Do not use any products that contain nicotine or tobacco, such as cigarettes and e-cigarettes. If you need help quitting, ask your health care provider.  Work with a Social worker or diabetes educator to identify strategies to manage stress and any emotional and social challenges. What are some questions to ask my health care provider?  Do I need to meet with a diabetes educator?  Do I need to meet with a dietitian?  What number can I call if I have questions?  When are the best times to check my blood glucose? Where to find more information:  American Diabetes Association: diabetes.org/food-and-fitness/food  Academy of Nutrition and Dietetics: PokerClues.dk  [employer] of Diabetes and Digestive and Kidney Diseases (NIH): ContactWire.be Summary  A healthy meal plan will help you control your blood glucose and maintain a healthy lifestyle.  Working with a diet and nutrition specialist (dietitian) can help you make a meal plan that is best for you.  Keep in mind that carbohydrates and alcohol have immediate effects on your blood glucose  levels. It is important to count carbohydrates and to use alcohol carefully. This information is not intended to replace advice given to you by your health care provider. Make sure you discuss any questions you have with your health care provider. Document Released: 06/12/2005 Document Revised: 10/20/2016 Document Reviewed: 10/20/2016 Elsevier Interactive Patient Education  2018 [employer].  Paresthesia Paresthesia is a burning or prickling feeling. This feeling can happen in any part of the body. It often happens in the hands, arms, legs, or feet. Usually, it is not painful. In most cases, the feeling goes away in a short time and is not a sign of a serious  problem. Follow these instructions at home:  Avoid drinking alcohol.  Try massage or needle therapy (acupuncture) to help with your problems.  Keep all follow-up visits as told by your doctor. This is important. Contact a doctor if:  You keep on having episodes of paresthesia.  Your burning or prickling feeling gets worse when you walk.  You have pain or cramps.  You feel dizzy.  You have a rash. Get help right away if:  You feel weak.  You have trouble walking or moving.  You have problems speaking, understanding, or seeing.  You feel confused.  You cannot control when you pee (urinate) or poop (bowel movement).  You lose feeling (numbness) after an injury.  You pass out (faint). This information is not intended to replace advice given to you by your health care provider. Make sure you discuss any questions you have with your health care provider. Document Released: 08/28/2008 Document Revised: 02/21/2016 Document Reviewed: 09/11/2014 Elsevier Interactive Patient Education  [employer].

## 2018-02-09 NOTE — Assessment & Plan Note (Signed)
Could be a manifestation of a peripheral neuropathy secondary to diabetes.  Recommend that he starts metformin 500 mg twice a day.  He has never taken it.  Also recommend a neurology evaluation for possible nerve conduction testing.

## 2018-02-18 ENCOUNTER — Other Ambulatory Visit: Payer: Self-pay | Admitting: Physician Assistant

## 2018-02-18 DIAGNOSIS — I10 Essential (primary) hypertension: Secondary | ICD-10-CM

## 2018-02-18 NOTE — Telephone Encounter (Signed)
Prinzide 20-12.5 mg refill request  LOV 01/08/18 with Dr. Leretha Pol.   Needs F/U appt for July.   None scheduled.    Needs appt per notes 90 days.

## 2018-04-15 ENCOUNTER — Encounter: Payer: Self-pay | Admitting: Family Medicine

## 2018-04-19 ENCOUNTER — Ambulatory Visit: Payer: PRIVATE HEALTH INSURANCE | Admitting: Family Medicine

## 2018-04-23 ENCOUNTER — Ambulatory Visit: Payer: PRIVATE HEALTH INSURANCE | Admitting: Diagnostic Neuroimaging

## 2018-04-27 ENCOUNTER — Encounter: Payer: Self-pay | Admitting: Family Medicine

## 2018-04-27 ENCOUNTER — Ambulatory Visit: Payer: PRIVATE HEALTH INSURANCE | Admitting: Family Medicine

## 2018-04-30 ENCOUNTER — Telehealth: Payer: Self-pay | Admitting: Family Medicine

## 2018-04-30 NOTE — Telephone Encounter (Signed)
Left VM advising pt that their appt with Santiago on 05/06/18 will be at the [facility]. °

## 2018-05-05 ENCOUNTER — Telehealth: Payer: Self-pay | Admitting: Family Medicine

## 2018-05-06 ENCOUNTER — Ambulatory Visit: Payer: PRIVATE HEALTH INSURANCE | Admitting: Family Medicine

## 2018-05-18 ENCOUNTER — Encounter: Payer: Self-pay | Admitting: Family Medicine

## 2018-05-19 ENCOUNTER — Encounter: Payer: Self-pay | Admitting: Physician Assistant

## 2018-05-19 ENCOUNTER — Ambulatory Visit (INDEPENDENT_AMBULATORY_CARE_PROVIDER_SITE_OTHER): Payer: PRIVATE HEALTH INSURANCE | Admitting: Physician Assistant

## 2018-05-19 ENCOUNTER — Other Ambulatory Visit: Payer: Self-pay

## 2018-05-19 VITALS — BP 138/88 | HR 86 | Temp 98.0°F | Resp 16 | Ht 68.0 in | Wt 370.0 lb

## 2018-05-19 DIAGNOSIS — R591 Generalized enlarged lymph nodes: Secondary | ICD-10-CM

## 2018-05-19 LAB — POCT RAPID STREP A (OFFICE): Rapid Strep A Screen: NEGATIVE

## 2018-05-19 MED ORDER — DOXYCYCLINE HYCLATE 100 MG PO CAPS: 100.0000 mg | ORAL_CAPSULE | Freq: Two times a day (BID) | ORAL | 0 refills | Status: AC

## 2018-05-19 MED ORDER — AZITHROMYCIN 250 MG PO TABS: ORAL_TABLET | ORAL | 0 refills | Status: AC

## 2018-05-19 NOTE — Patient Instructions (Addendum)
  Come back next Wednesday so I can see how you are doing.    If you have lab work done today you will be contacted with your lab results within the next 2 weeks.  If you have not heard from us then please contact us. The fastest way to get your results is to register for My Chart.   IF you received an x-ray today, you will receive an invoice from [facility]. Please contact [facility] at [phone] with questions or concerns regarding your invoice.   IF you received labwork today, you will receive an invoice from [facility]. Please contact [facility] at [phone] with questions or concerns regarding your invoice.   Our billing staff will not be able to assist you with questions regarding bills from these companies.  You will be contacted with the lab results as soon as they are available. The fastest way to get your results is to activate your My Chart account. Instructions are located on the last page of this paperwork. If you have not heard from us regarding the results in 2 weeks, please contact this office.

## 2018-05-19 NOTE — Progress Notes (Signed)
05/19/2018 3:30 PM   DOB: 04/20/1981 / MRN: [id number]  SUBJECTIVE:  Lance Neal is a 37 y.o. male presenting for lymphadenopathy and tonsillar "white patches." He is MSM and I recently treated his partner for chlamydial LGV who has since made a complete recovery. Lance Neal, unfortunately, was never treated throughout this process.   He is allergic to no known allergies.   He  has a past medical history of Hypertension, Obesity, and Palpitation.    He  reports that he has quit smoking. His smoking use included cigarettes. He quit after 1.00 year of use. He has never used smokeless tobacco. He reports that he does not drink alcohol or use drugs. He  reports that he currently engages in sexual activity and has had partner(s) who are Male. The patient  has no past surgical history on file.  His family history includes Cancer in his father and mother; Diabetes in his father and mother; Heart failure in his father and mother.  Review of Systems  Constitutional: Negative for chills, diaphoresis and fever.  HENT: Positive for sore throat.   Eyes: Negative.   Respiratory: Negative for cough, hemoptysis, sputum production, shortness of breath and wheezing.   Cardiovascular: Negative for chest pain, orthopnea and leg swelling.  Gastrointestinal: Negative for nausea.  Skin: Negative for rash.  Neurological: Negative for dizziness, sensory change, speech change, focal weakness and headaches.    The problem list and medications were reviewed and updated by myself where necessary and exist elsewhere in the encounter.   OBJECTIVE:  BP 138/88   Pulse 86   Temp 98 F (36.7 C)   Resp 16   Ht 5\' 8"  (1.727 m)   Wt (!) 370 lb (167.8 kg)   SpO2 96%   BMI 56.26 kg/m   Wt Readings from Last 3 Encounters:  05/19/18 (!) 370 lb (167.8 kg)  02/09/18 (!) 372 lb 9.6 oz (169 kg)  01/11/18 (!) 368 lb 12.8 oz (167.3 kg)   Temp Readings from Last 3 Encounters:  05/19/18 98 F (36.7 C)    02/09/18 98.9 F (37.2 C) (Oral)  01/11/18 98 F (36.7 C) (Oral)   BP Readings from Last 3 Encounters:  05/19/18 138/88  02/09/18 (!) 130/108  01/11/18 116/86   Pulse Readings from Last 3 Encounters:  05/19/18 86  02/09/18 82  01/11/18 93    Physical Exam  HENT:  Right Ear: Hearing, tympanic membrane, external ear and ear canal normal.  Left Ear: Hearing, tympanic membrane, external ear and ear canal normal.  Nose: Nose normal. Right sinus exhibits no maxillary sinus tenderness and no frontal sinus tenderness. Left sinus exhibits no maxillary sinus tenderness and no frontal sinus tenderness.  Mouth/Throat: Uvula is midline, oropharynx is clear and moist and mucous membranes are normal. No trismus in the jaw. No uvula swelling. No oropharyngeal exudate, posterior oropharyngeal edema or tonsillar abscesses. Tonsils are 2+ on the right. Tonsils are 1+ on the left.    Eyes: Pupils are equal, round, and reactive to light. Conjunctivae are normal.  Lymphadenopathy:       Head (right side): Tonsillar (mildly tender without rash) adenopathy present. No submental, no submandibular, no preauricular, no posterior auricular and no occipital adenopathy present.       Head (left side): No submandibular and no tonsillar adenopathy present.    He has no cervical adenopathy.  Vitals reviewed.   Lab Results  Component Value Date   HGBA1C 6.2 01/08/2018  Lab Results  Component Value Date   WBC 7.3 04/05/2011   HGB 16.3 01/12/2014   HCT 48.0 01/12/2014   MCV 81.4 04/05/2011   PLT 357 04/05/2011    Lab Results  Component Value Date   CREATININE 0.87 01/08/2018   BUN 11 01/08/2018   NA 141 01/08/2018   K 3.8 01/08/2018   CL 102 01/08/2018   CO2 24 01/08/2018    Lab Results  Component Value Date   ALT 24 01/08/2018   AST 28 01/08/2018   ALKPHOS 70 01/08/2018   BILITOT 0.5 01/08/2018    Lab Results  Component Value Date   TSH 1.010 01/08/2018    Lab Results   Component Value Date   CHOL 181 06/20/2016   HDL 44 06/20/2016   LDLCALC 122 06/20/2016   TRIG 77 06/20/2016   CHOLHDL 4.1 06/20/2016     ASSESSMENT AND PLAN:  Lance Neal was seen today for sore throat and std screening.  Diagnoses and all orders for this visit:  Lymphadenopathy of head and neck: See HPI. I am also treating his partner.  -     GC/Chlamydia Probe Amp -     POCT rapid strep A -     Culture, Group A Strep -     HIV antibody -     RPR -     Trichomonas vaginalis, RNA -     azithromycin (ZITHROMAX) 250 MG tablet; Take four tabs once with a meal. -     doxycycline (VIBRAMYCIN) 100 MG capsule; Take 1 capsule (100 mg total) by mouth 2 (two) times daily for 10 days.    The patient is advised to call or return to clinic if he does not see an improvement in symptoms, or to seek the care of the closest emergency department if he worsens with the above plan.   Deliah BostonMichael Clark, MHS, PA-C Primary Care at [facility] 05/19/2018 3:30 PM

## 2018-05-20 LAB — HIV ANTIBODY (ROUTINE TESTING W REFLEX): HIV Screen 4th Generation wRfx: NONREACTIVE

## 2018-05-20 LAB — RPR: RPR: NONREACTIVE

## 2018-05-21 LAB — TRICHOMONAS VAGINALIS, PROBE AMP: Trich vag by NAA: NEGATIVE

## 2018-05-21 LAB — GC/CHLAMYDIA PROBE AMP
Chlamydia trachomatis, NAA: NEGATIVE
Neisseria gonorrhoeae by PCR: NEGATIVE

## 2018-05-21 LAB — CULTURE, GROUP A STREP: STREP A CULTURE: NEGATIVE

## 2018-05-26 ENCOUNTER — Ambulatory Visit: Payer: PRIVATE HEALTH INSURANCE | Admitting: Physician Assistant

## 2018-07-02 ENCOUNTER — Other Ambulatory Visit: Payer: Self-pay | Admitting: Physician Assistant

## 2018-07-02 DIAGNOSIS — I10 Essential (primary) hypertension: Secondary | ICD-10-CM

## 2018-07-04 ENCOUNTER — Telehealth: Payer: Self-pay | Admitting: Physician Assistant

## 2018-07-04 DIAGNOSIS — I10 Essential (primary) hypertension: Secondary | ICD-10-CM

## 2018-07-06 MED ORDER — LISINOPRIL-HYDROCHLOROTHIAZIDE 20-12.5 MG PO TABS: 1.0000 | ORAL_TABLET | Freq: Two times a day (BID) | ORAL | 0 refills | Status: DC

## 2018-07-06 NOTE — Telephone Encounter (Signed)
Pt is scheduled for 07/30/18 with Dr. Hadley Pen and would like to see if his meds can be refilled to last until that appt? Please advise pt.

## 2018-07-06 NOTE — Telephone Encounter (Signed)
Refill request for lisinopril-hctz 20-12.5 mg #30 with 0 refills.  Notified pt via phone per ROI left message we will only fill for #30 with 0 refills and pt will need to call office and schedule ov for TOC/medication refill.  Advised once ov is setup and appt kept we will then be able to fill med for 90 day supply.  Advised to call if he has any further questions or concerns. Dgaddy, CMA

## 2018-07-22 NOTE — Telephone Encounter (Signed)
done

## 2018-07-30 ENCOUNTER — Other Ambulatory Visit: Payer: Self-pay | Admitting: Family Medicine

## 2018-07-30 ENCOUNTER — Ambulatory Visit: Payer: PRIVATE HEALTH INSURANCE | Admitting: Family Medicine

## 2018-07-30 DIAGNOSIS — I10 Essential (primary) hypertension: Secondary | ICD-10-CM

## 2018-07-30 MED ORDER — LISINOPRIL-HYDROCHLOROTHIAZIDE 20-12.5 MG PO TABS: 1.0000 | ORAL_TABLET | Freq: Two times a day (BID) | ORAL | 0 refills | Status: DC

## 2018-07-30 NOTE — Telephone Encounter (Signed)
Requested medication (s) are due for refill today: yes  Requested medication (s) are on the active medication list: yes}  Last refill:  07/06/18  #30  Nani Gasser  Future visit scheduled: yes 07/30/18  Notes to clinic:   Last refill note states pt "must complete this visit" before any refill. Appointment today.    Requested Prescriptions  Pending Prescriptions Disp Refills   lisinopril-hydrochlorothiazide (PRINZIDE,ZESTORETIC) 20-12.5 MG tablet [Pharmacy Med Name: LISINOPRIL-HCTZ 20-12.5 MG TAB] 30 tablet 0    Sig: Take 1 tablet by mouth 2 (two) times daily. Office visit needed for 90 day supply     Cardiovascular:  ACEI + Diuretic Combos Failed - 07/30/2018  2:02 AM      Failed - Na in normal range and within 180 days    Sodium  Date Value Ref Range Status  01/08/2018 141 134 - 144 mmol/L Final         Failed - K in normal range and within 180 days    Potassium  Date Value Ref Range Status  01/08/2018 3.8 3.5 - 5.2 mmol/L Final         Failed - Cr in normal range and within 180 days    Creat  Date Value Ref Range Status  08/14/2016 0.81 0.60 - 1.35 mg/dL Final   Creatinine, Ser  Date Value Ref Range Status  01/08/2018 0.87 0.76 - 1.27 mg/dL Final         Failed - Ca in normal range and within 180 days    Calcium  Date Value Ref Range Status  01/08/2018 8.9 8.7 - 10.2 mg/dL Final         Passed - Patient is not pregnant      Passed - Last BP in normal range    BP Readings from Last 1 Encounters:  05/19/18 138/88         Passed - Valid encounter within last 6 months    Recent Outpatient Visits          2 months ago Lymphadenopathy of head and neck   [facility] at [facility], Marolyn Hammock, PA-C   5 months ago Paresthesia of both hands   Primary Care at [place], [place], MD   6 months ago Nausea and vomiting, intractability of vomiting not specified, unspecified vomiting type   Primary Care at [facility], Madelaine Bhat, PA-C   6 months ago  Essential hypertension   [facility] at [facility], Meda Coffee, MD   7 months ago Screening-pulmonary TB   [facility] at [facility], Meda Coffee, MD      Future Appointments            Today Myles Lipps, MD Primary Care at [place], [facility]

## 2018-07-30 NOTE — Telephone Encounter (Signed)
Requested Prescriptions  Pending Prescriptions Disp Refills  . lisinopril-hydrochlorothiazide (PRINZIDE,ZESTORETIC) 20-12.5 MG tablet 90 tablet 0    Sig: Take 1 tablet by mouth 2 (two) times daily. Office visit needed for 90 day supply     Cardiovascular:  ACEI + Diuretic Combos Failed - 07/30/2018 10:15 AM      Failed - Na in normal range and within 180 days    Sodium  Date Value Ref Range Status  01/08/2018 141 134 - 144 mmol/L Final         Failed - K in normal range and within 180 days    Potassium  Date Value Ref Range Status  01/08/2018 3.8 3.5 - 5.2 mmol/L Final         Failed - Cr in normal range and within 180 days    Creat  Date Value Ref Range Status  08/14/2016 0.81 0.60 - 1.35 mg/dL Final   Creatinine, Ser  Date Value Ref Range Status  01/08/2018 0.87 0.76 - 1.27 mg/dL Final         Failed - Ca in normal range and within 180 days    Calcium  Date Value Ref Range Status  01/08/2018 8.9 8.7 - 10.2 mg/dL Final         Passed - Patient is not pregnant      Passed - Last BP in normal range    BP Readings from Last 1 Encounters:  05/19/18 138/88         Passed - Valid encounter within last 6 months    Recent Outpatient Visits          2 months ago Lymphadenopathy of head and neck   [facility] at [facility], Marolyn Hammock, PA-C   5 months ago Paresthesia of both hands   Primary Care at [place], [place], MD   6 months ago Nausea and vomiting, intractability of vomiting not specified, unspecified vomiting type   Primary Care at [facility], Madelaine Bhat, PA-C   6 months ago Essential hypertension   [facility] at [facility], Meda Coffee, MD   7 months ago Screening-pulmonary TB   [facility] Care at [facility], Meda Coffee, MD      Future Appointments            In 2 weeks Myles Lipps, MD Primary Care at [place], [facility]

## 2018-07-30 NOTE — Telephone Encounter (Signed)
Copied from CRM [id number]. Topic: Quick Communication - Rx Refill/Question >> Jul 30, 2018 10:10 AM Lynne Logan D wrote: Medication: lisinopril-hydrochlorothiazide (PRINZIDE,ZESTORETIC) 20-12.5 MG tablet / Patient has one day left. Would like to know if Lisinopril can be filled through is follow up appointment on 08/18/18. Please advise  Has the patient contacted their pharmacy? No. (Agent: If no, request that the patient contact the pharmacy for the refill.) (Agent: If yes, when and what did the pharmacy advise?)  Preferred Pharmacy (with phone number or street name): [facility] [phone] Ginette Otto, [place] - [address] AT [place] [address] [phone] (Phone) [phone] (Fax)    Agent: Please be advised that RX refills may take up to 3 business days. We ask that you follow-up with your pharmacy.

## 2018-08-18 ENCOUNTER — Ambulatory Visit: Payer: PRIVATE HEALTH INSURANCE | Admitting: Family Medicine

## 2018-09-02 ENCOUNTER — Telehealth: Payer: Self-pay | Admitting: Podiatry

## 2018-09-02 NOTE — Telephone Encounter (Signed)
Pt wife called and left message and I returned call.She asked if they could get a rx for diabetic shoes/inserts that pt is [facility] and has an appt tomorrow to get the shoes and inserts.I gave them the rx that was already written and a copy of the office note.(oked by Dr Samuella CotaPrice) She then asked if pt could get the rx to physical therapy for the [facility] and per Dr Samuella CotaPrice [facility] because of the piece of equipment that they use and the [facility] will not have it.  Per Dr Lisabeth RegisterPrice,I told pt to see if they could get the [facility] to refer him [facility].She asked if they should cancel the appts with [facility] [place] [place] of [place] and I told them that would be up to them.

## 2018-10-09 ENCOUNTER — Other Ambulatory Visit: Payer: Self-pay | Admitting: Family Medicine

## 2018-10-09 DIAGNOSIS — I10 Essential (primary) hypertension: Secondary | ICD-10-CM

## 2018-10-11 NOTE — Telephone Encounter (Signed)
Refill review

## 2018-10-11 NOTE — Telephone Encounter (Signed)
Courtesy medication given until appointment on 11/12/2018, for 62 tabs.    ACEI + Diuretic combos Failed. Last labs 01/08/18. Requested Prescriptions  Pending Prescriptions Disp Refills  . lisinopril-hydrochlorothiazide (PRINZIDE,ZESTORETIC) 20-12.5 MG tablet [Pharmacy Med Name: LISINOPRIL-HCTZ 20-12.5 MG TAB] 62 tablet 0    Sig: TAKE 1 TABLET BY MOUTH 2 (TWO) TIMES DAILY. OFFICE VISIT NEEDED FOR 82 DAY SUPPLY     Cardiovascular:  ACEI + Diuretic Combos Failed - 10/09/2018  1:45 PM      Failed - Na in normal range and within 180 days    Sodium  Date Value Ref Range Status  01/08/2018 141 134 - 144 mmol/L Final         Failed - K in normal range and within 180 days    Potassium  Date Value Ref Range Status  01/08/2018 3.8 3.5 - 5.2 mmol/L Final         Failed - Cr in normal range and within 180 days    Creat  Date Value Ref Range Status  08/14/2016 0.81 0.60 - 1.35 mg/dL Final   Creatinine, Ser  Date Value Ref Range Status  01/08/2018 0.87 0.76 - 1.27 mg/dL Final         Failed - Ca in normal range and within 180 days    Calcium  Date Value Ref Range Status  01/08/2018 8.9 8.7 - 10.2 mg/dL Final         Passed - Patient is not pregnant      Passed - Last BP in normal range    BP Readings from Last 1 Encounters:  05/19/18 138/88         Passed - Valid encounter within last 6 months    Recent Outpatient Visits          4 months ago Lymphadenopathy of head and neck   [facility] at [facility], Marolyn Hammock, PA-C   8 months ago Paresthesia of both hands   Primary Care at [place], [place], MD   9 months ago Nausea and vomiting, intractability of vomiting not specified, unspecified vomiting type   Primary Care at [facility], Madelaine Bhat, PA-C   9 months ago Essential hypertension   [facility] at [facility], Meda Coffee, MD   9 months ago Screening-pulmonary TB   Primary Care at [facility], Meda Coffee, MD      Future Appointments            In  1 month Leretha Pol, Meda Coffee, MD Primary Care at [place], [facility]

## 2018-11-12 ENCOUNTER — Ambulatory Visit: Payer: PRIVATE HEALTH INSURANCE | Admitting: Family Medicine

## 2019-02-26 ENCOUNTER — Other Ambulatory Visit: Payer: Self-pay | Admitting: Physician Assistant

## 2019-02-26 DIAGNOSIS — R591 Generalized enlarged lymph nodes: Secondary | ICD-10-CM

## 2019-03-02 IMAGING — DX DG CHEST 2V
2 series · 2 of 2 positions shown · non-contrast
Comparison: 04/05/2011

CLINICAL DATA: Tuberculosis screening. Treated for latent TB in
9555.

EXAM:
CHEST - 2 VIEW

[chest pa]
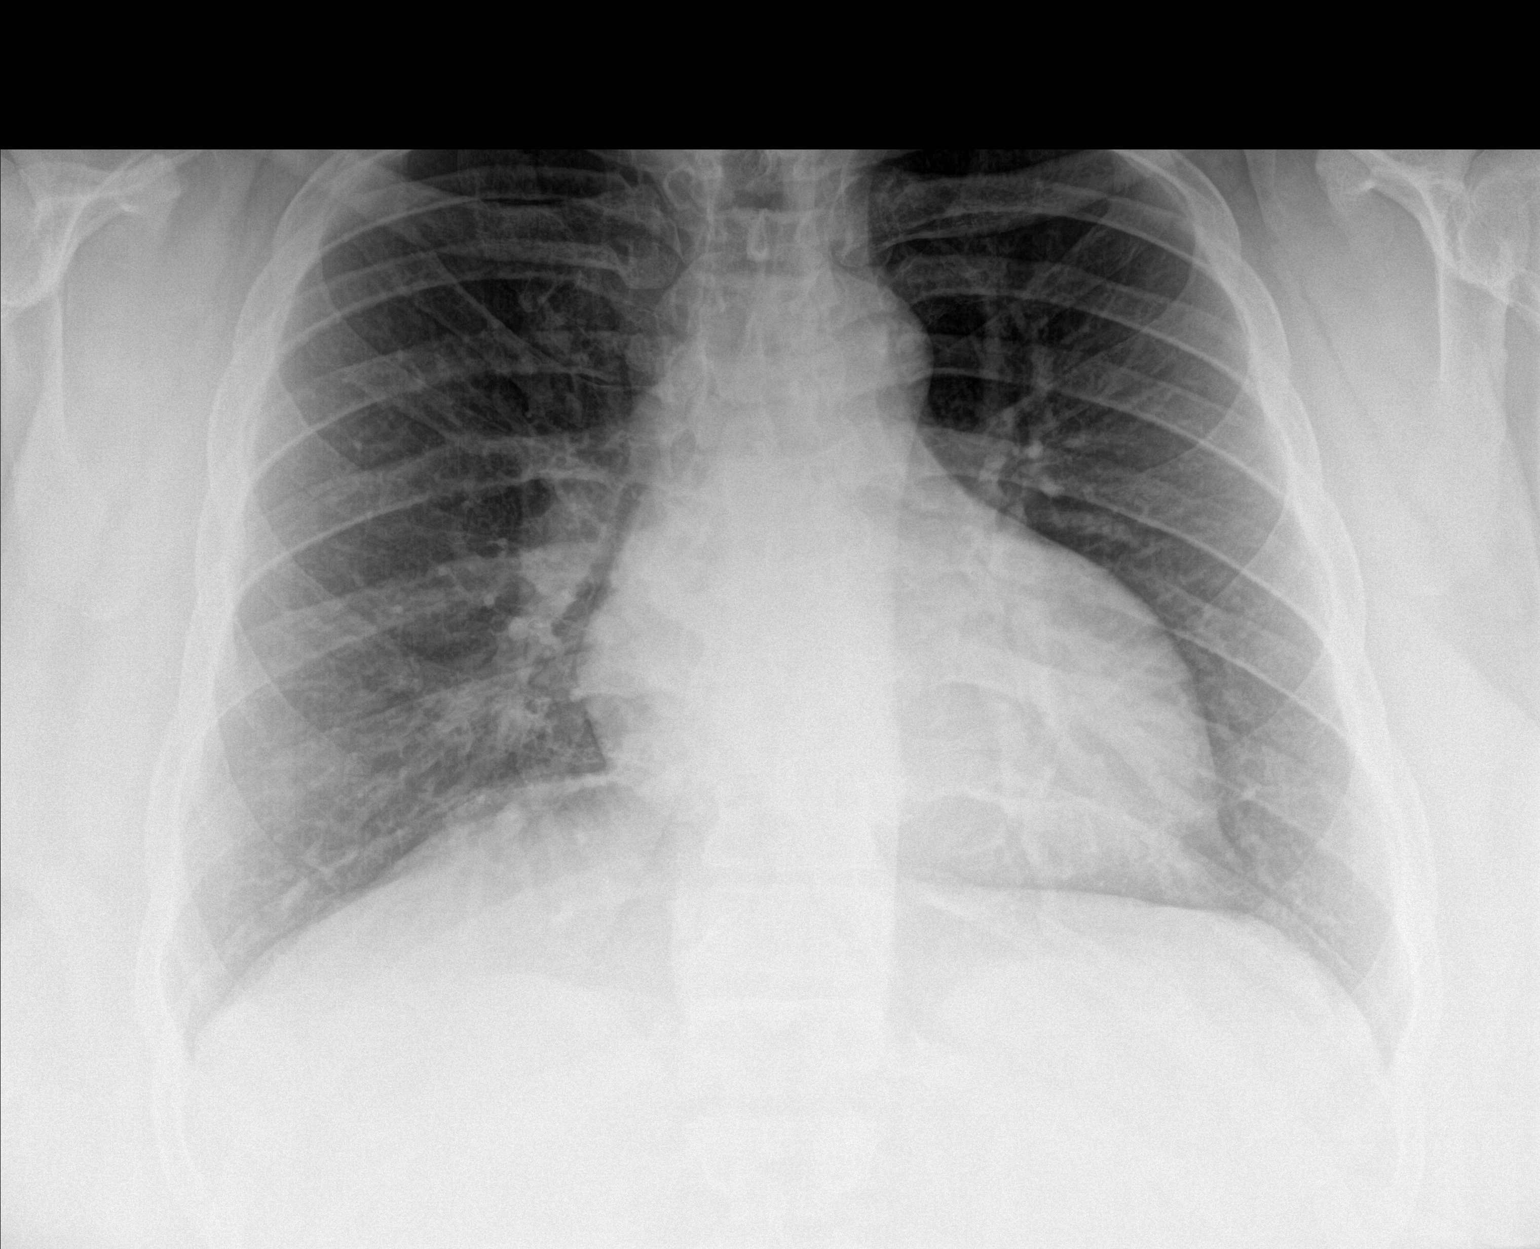

[chest lat]
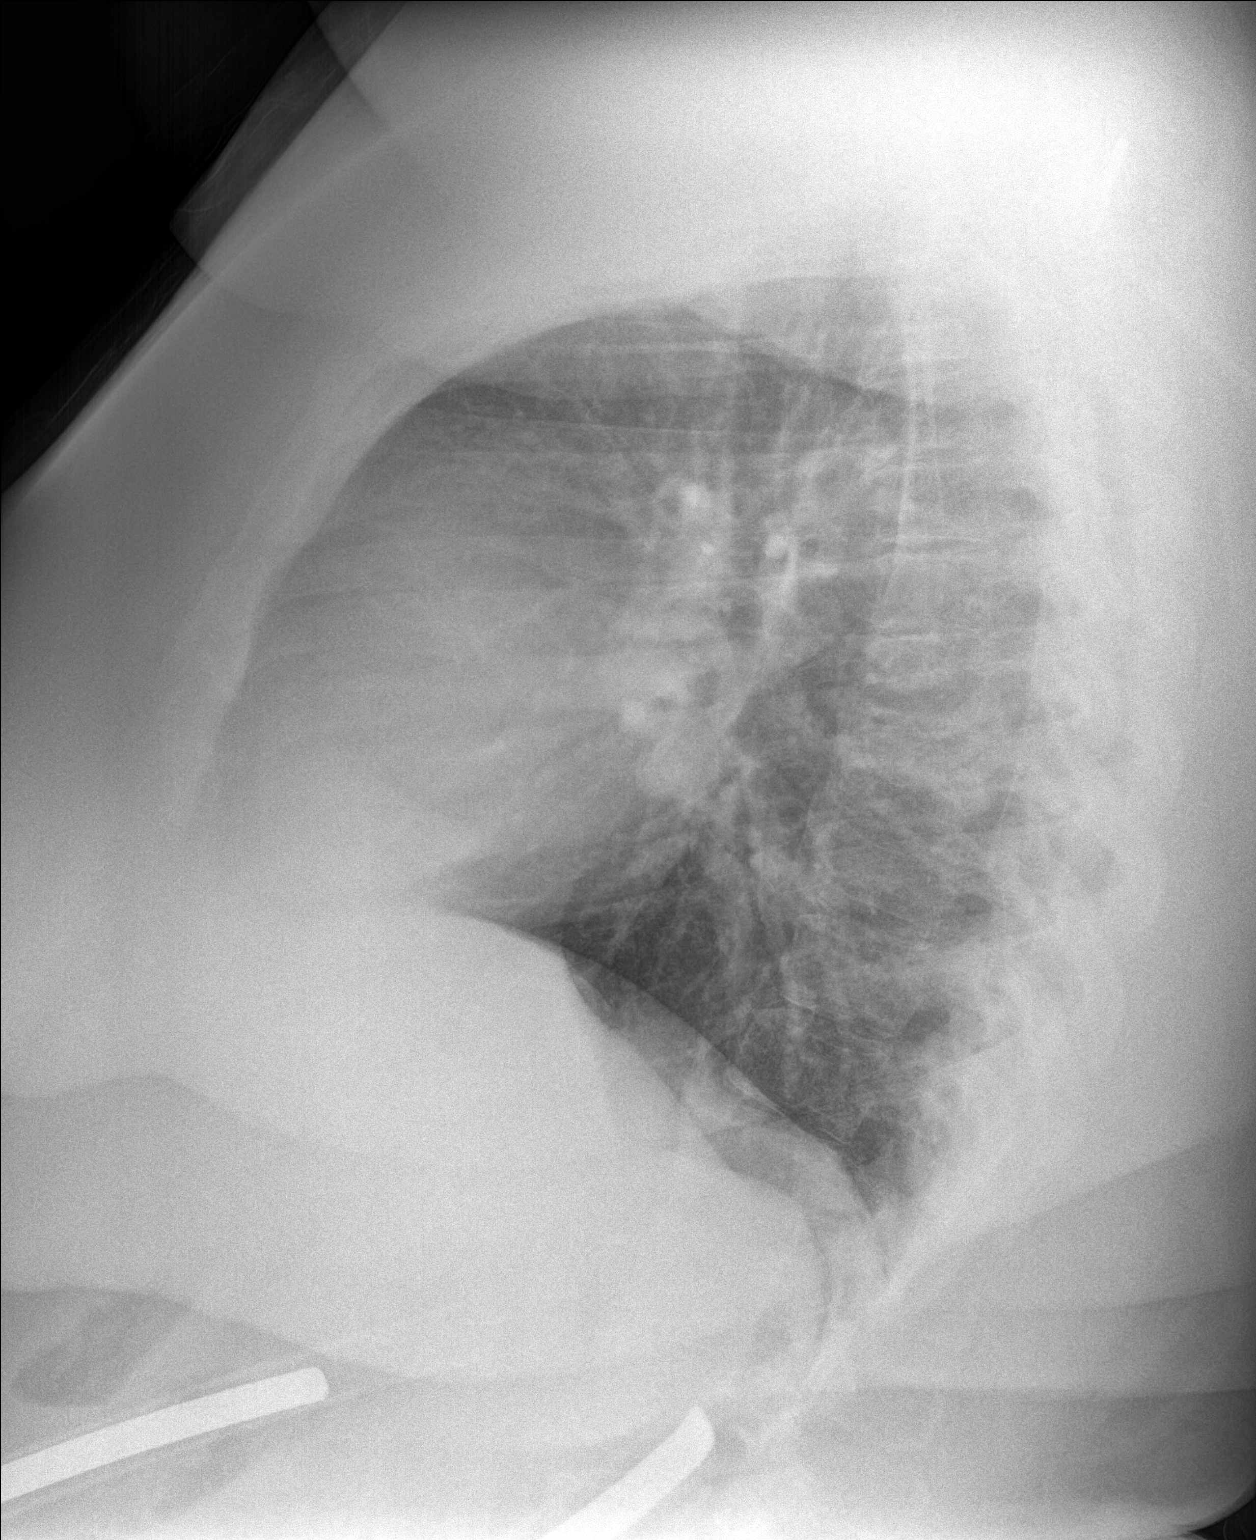

[2 of 2 positions shown; findings below may reference images not displayed]

FINDINGS: The cardiac silhouette is upper limits of normal in size. No
airspace consolidation, edema, pleural effusion, or pneumothorax is
identified. No acute osseous abnormality is seen.
IMPRESSION: No active cardiopulmonary disease.

## 2021-04-25 DIAGNOSIS — Z20822 Contact with and (suspected) exposure to covid-19: Secondary | ICD-10-CM | POA: Diagnosis not present

## 2021-05-02 DIAGNOSIS — Z20822 Contact with and (suspected) exposure to covid-19: Secondary | ICD-10-CM | POA: Diagnosis not present
